# Patient Record
Sex: Female | Born: 1995 | Race: Black or African American | Hispanic: No | Marital: Single | Smoking: Former smoker
Health system: Southern US, Community
[De-identification: ages and names within clinical notes are randomized; demographics above are authoritative.]

## PROBLEM LIST (undated history)

## (undated) DIAGNOSIS — Z789 Other specified health status: Secondary | ICD-10-CM

## (undated) DIAGNOSIS — Z9109 Other allergy status, other than to drugs and biological substances: Secondary | ICD-10-CM

## (undated) HISTORY — PX: EYE MUSCLE SURGERY: SHX370

## (undated) HISTORY — PX: EYE SURGERY: SHX253

---
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
## 2017-01-13 ENCOUNTER — Emergency Department (HOSPITAL_COMMUNITY)
Admission: EM | Admit: 2017-01-13 | Discharge: 2017-01-14 | Disposition: A | Discharge status: Home / Self Care | Payer: No Typology Code available for payment source | Attending: Emergency Medicine | Admitting: Emergency Medicine

## 2017-01-13 ENCOUNTER — Encounter (HOSPITAL_COMMUNITY): Payer: Self-pay

## 2017-01-13 ENCOUNTER — Inpatient Hospital Stay (HOSPITAL_COMMUNITY)
Admission: AD | Admit: 2017-01-13 | Discharge: 2017-01-13 | Disposition: A | Discharge status: Home / Self Care | Payer: No Typology Code available for payment source | Source: Ambulatory Visit | Attending: Obstetrics and Gynecology | Admitting: Obstetrics and Gynecology

## 2017-01-13 DIAGNOSIS — R51 Headache: Secondary | ICD-10-CM | POA: Diagnosis not present

## 2017-01-13 DIAGNOSIS — M549 Dorsalgia, unspecified: Secondary | ICD-10-CM | POA: Diagnosis not present

## 2017-01-13 DIAGNOSIS — R110 Nausea: Secondary | ICD-10-CM | POA: Diagnosis not present

## 2017-01-13 DIAGNOSIS — N760 Acute vaginitis: Secondary | ICD-10-CM | POA: Insufficient documentation

## 2017-01-13 DIAGNOSIS — J45909 Unspecified asthma, uncomplicated: Secondary | ICD-10-CM | POA: Diagnosis not present

## 2017-01-13 DIAGNOSIS — M940 Chondrocostal junction syndrome [Tietze]: Secondary | ICD-10-CM | POA: Insufficient documentation

## 2017-01-13 DIAGNOSIS — B9689 Other specified bacterial agents as the cause of diseases classified elsewhere: Secondary | ICD-10-CM

## 2017-01-13 DIAGNOSIS — R1030 Lower abdominal pain, unspecified: Secondary | ICD-10-CM | POA: Insufficient documentation

## 2017-01-13 DIAGNOSIS — R0789 Other chest pain: Secondary | ICD-10-CM

## 2017-01-13 DIAGNOSIS — R55 Syncope and collapse: Secondary | ICD-10-CM | POA: Diagnosis present

## 2017-01-13 LAB — URINALYSIS, ROUTINE W REFLEX MICROSCOPIC
BILIRUBIN URINE: NEGATIVE
GLUCOSE, UA: NEGATIVE mg/dL
Hgb urine dipstick: NEGATIVE
KETONES UR: NEGATIVE mg/dL
LEUKOCYTES UA: NEGATIVE
Nitrite: NEGATIVE
PH: 5 (ref 5.0–8.0)
Protein, ur: 30 mg/dL — AB
SPECIFIC GRAVITY, URINE: 1.025 (ref 1.005–1.030)

## 2017-01-13 LAB — POCT PREGNANCY, URINE: Preg Test, Ur: NEGATIVE

## 2017-01-13 LAB — WET PREP, GENITAL
SPERM: NONE SEEN
TRICH WET PREP: NONE SEEN
YEAST WET PREP: NONE SEEN

## 2017-01-13 MED ORDER — IBUPROFEN 800 MG PO TABS
800.0000 mg | ORAL_TABLET | Freq: Once | ORAL | Status: AC
  Administered 2017-01-13: 800 mg via ORAL
  Filled 2017-01-13: qty 1

## 2017-01-13 MED ORDER — SODIUM CHLORIDE 0.9 % IV BOLUS (SEPSIS)
1000.0000 mL | Freq: Once | INTRAVENOUS | Status: AC
  Administered 2017-01-13: 1000 mL via INTRAVENOUS

## 2017-01-13 MED ORDER — METRONIDAZOLE 500 MG PO TABS: 500.0000 mg | ORAL_TABLET | Freq: Two times a day (BID) | ORAL | 0 refills | Status: DC

## 2017-01-13 MED ORDER — IBUPROFEN 800 MG PO TABS: 800.0000 mg | ORAL_TABLET | Freq: Once | ORAL | 0 refills | Status: AC

## 2017-01-13 MED ORDER — MEDROXYPROGESTERONE ACETATE 150 MG/ML IM SUSP
150.0000 mg | Freq: Once | INTRAMUSCULAR | Status: AC
  Administered 2017-01-13: 150 mg via INTRAMUSCULAR
  Filled 2017-01-13: qty 1

## 2017-01-13 MED ORDER — KETOROLAC TROMETHAMINE 30 MG/ML IJ SOLN
15.0000 mg | Freq: Once | INTRAMUSCULAR | Status: AC
  Administered 2017-01-13: 15 mg via INTRAVENOUS
  Filled 2017-01-13: qty 1

## 2017-01-13 NOTE — MAU Provider Note (Signed)
History   pt is nulparous BF last dose DMPA feb in with low abd pain x 3 wks. 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 Desires referral to GYN clinic for care and contraceptive management  CSN: [id number]  Arrival date & time 01/13/17  1116   None     Chief Complaint  Patient presents with  . Back Pain  . Headache  . Nausea    HPI  Past Medical History:  Diagnosis Date  . Asthma   . Pollen allergies     Past Surgical History:  Procedure Laterality Date  . EYE MUSCLE SURGERY     x 2 one left and one right - lazy eye  . EYE SURGERY    . LAPAROSCOPIC UNILATERAL SALPINGO OOPHERECTOMY Left 07/02/2014   Procedure: LAPAROSCOPIC LEFT OOPHERECTOMY;  Surgeon: Mora Bellman, MD;  Location: [facility];  Service: Gynecology;  Laterality: Left;    Family History  Problem Relation Age of Onset  . Diabetes Mother   . Diabetes Father   . Cancer Maternal Grandmother     Social History  Substance Use Topics  . Smoking status: Never Smoker  . Smokeless tobacco: Never Used  . Alcohol use No    OB History    Gravida Para Term Preterm AB Living   0 0 0 0 0 0   SAB TAB Ectopic Multiple Live Births   0 0 0 0        Review of Systems  Constitutional: Negative.   HENT: Negative.   Eyes: Negative.   Respiratory: Negative.   Cardiovascular: Negative.   Gastrointestinal: Positive for abdominal pain.  Endocrine: Negative.   Genitourinary: Negative.   Musculoskeletal: Negative.   Skin: Negative.   Allergic/Immunologic: Negative.   Neurological: Negative.   Hematological: Negative.   Psychiatric/Behavioral: Negative.     Allergies  Other  Home Medications    BP 130/70 (BP Location: Left Arm)   Pulse 81   Temp 98.2 F (36.8 C)   Resp 16   Physical Exam  Constitutional: She is oriented to person, place, and time. She appears well-developed and well-nourished.  HENT:  Head: Normocephalic.  Eyes: Pupils are equal, round, and reactive to light.  Neck: Normal  range of motion.  Cardiovascular: Normal rate, regular rhythm, normal heart sounds and intact distal pulses.   Pulmonary/Chest: Effort normal and breath sounds normal.  Abdominal: Soft. Bowel sounds are normal.  Genitourinary: Vaginal discharge found.  Musculoskeletal: Normal range of motion.  Neurological: She is alert and oriented to person, place, and time. She has normal reflexes.  Skin: Skin is warm and dry.  Psychiatric: She has a normal mood and affect. Her behavior is normal. Judgment and thought content normal.    MAU Course  Procedures (including critical care time)  Labs Reviewed  WET PREP, GENITAL  URINALYSIS, ROUTINE W REFLEX MICROSCOPIC  POCT PREGNANCY, URINE  GC/CHLAMYDIA PROBE AMP () NOT AT [facility]   No results found.   Dx: BV   MDM  Wet prep BV. Chla and GC obtained and to lab VSS, abd soft and non tender. Will refer to GYN clinic for eval. Flagyl 500 BID. D/C home

## 2017-01-13 NOTE — ED Provider Notes (Signed)
[facility] DEPT Provider Note   CSN: [id number] Arrival date & time: 01/13/17  2158 By signing my name below, I, Georgette Shell, attest that this documentation has been prepared under the direction and in the presence of Rolland Porter, MD. Electronically Signed: Georgette Shell, ED Scribe. 01/13/17. 11:34 PM.  Time seen: 23:20  History   Chief Complaint Chief Complaint  Patient presents with  . Near Syncope    HPI The history is provided by the patient and the EMS personnel. No language interpreter was used.   HPI Comments: Sheri Parker is a 21 y.o. female with h/o asthma, who presents to the Emergency Department complaining of constant chest pain beginning one week ago, worsening tonight. She states the pain is aching and constant and is diffusely across her whole chest. Pt also has associated clear rhinorrhea, nausea, shortness of breath, and cough that began 3 days ago. Pt further reports she has mild BLE pain and paresthesias that began three days ago. Pt states she has lost consciousness at least once a day for a week. Per EMS, they were called in because pt was near-syncope and had chest pain. She states she went upstairs because she felt short of breath and felt like she was going to pass out. She states she sat up in her bed in the room started spinning and her head got heavy and then she had a syncopal episode. Pt's CBG was 62 on scene and EMS had her eat candy which brought it up to 86. Pt reports she was unconscious and only vaguely remembers EMS on arrival. She was told her friends did CPR while waiting for EMS to arrive. She describes being diaphoretic when she woke up. She denies sneezing or sore throat. She states she's had nausea with vomiting for the past 2 days for total 3 times, and diarrhea once this week. She has been seen for this chest pain before the ED and was referred to cardiology which she has not done.  Pt is also complaining of intermittent, generalized lower abdominal pain  worsening two weeks ago. She states she has h/o an ovarian cyst that had to be removed one year ago and she has had this abdominal pain intermittently since. Pt also reports that this pain is due to her being "3-4 weeks late" on recieving her Depo-Provera shot. She was seen at 11 AM earlier today at [facility] for the same and was started on Flagyl twice a day. She also received her Depo-Provera shot. No recent long travel. Pt smokes marijuana but does not drink alcohol. Pt denies fever, chills, leg swelling, or any other associated symptoms.   PCP: None  Past Medical History:  Diagnosis Date  . Asthma   . Pollen allergies     Patient Active Problem List   Diagnosis Date Noted  . 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 Dizzy spells 01/04/2015  . Moderate headache 01/04/2015  . Left ovarian cyst 07/02/2014    Past Surgical History:  Procedure Laterality Date  . EYE MUSCLE SURGERY     x 2 one left and one right - lazy eye  . EYE SURGERY    . LAPAROSCOPIC UNILATERAL SALPINGO OOPHERECTOMY Left 07/02/2014   Procedure: LAPAROSCOPIC LEFT OOPHERECTOMY;  Surgeon: Mora Bellman, MD;  Location: [facility];  Service: Gynecology;  Laterality: Left;    OB History    Gravida Para Term Preterm AB Living   0 0 0 0 0 0   SAB TAB Ectopic Multiple Live Births  0 0 0 0         Home Medications    Prior to Admission medications   Medication Sig Start Date End Date Taking? Authorizing Provider  metroNIDAZOLE (FLAGYL) 500 MG tablet Take 1 tablet (500 mg total) by mouth 2 (two) times daily. 01/13/17   Keitha Butte, CNM    Family History Family History  Problem Relation Age of Onset  . Diabetes Mother   . Diabetes Father   . Cancer Maternal Grandmother     Social History Social History  Substance Use Topics  . Smoking status: Never Smoker  . Smokeless tobacco: Never Used  . Alcohol use No  employed + [employer] occassionally   Allergies   Other   Review of Systems Review of Systems    Constitutional: Negative for chills and fever.  HENT: Positive for rhinorrhea.   Respiratory: Positive for cough and shortness of breath.   Cardiovascular: Positive for chest pain. Negative for leg swelling.  Gastrointestinal: Positive for abdominal pain and nausea.  Neurological: Positive for syncope.  All other systems reviewed and are negative.    Physical Exam Updated Vital Signs BP 127/86   Pulse 66   Temp 98.7 F (37.1 C) (Oral)   Resp 18   Ht 5\' 10"  (1.778 m)   Wt 122 lb (55.3 kg)   SpO2 100%   BMI 17.51 kg/m   Vital signs normal    Orthostatic VS for the past 24 hrs:  BP- Lying Pulse- Lying BP- Sitting Pulse- Sitting BP- Standing at 0 minutes Pulse- Standing at 0 minutes  01/14/17 0009 115/74 58 112/81 87 120/84 95   Orthostatics positive for increase in pulse from lying to standing over 20 bpm    Physical Exam  Constitutional: She is oriented to person, place, and time. She appears well-developed and well-nourished.  Non-toxic appearance. She does not appear ill. No distress.  HENT:  Head: Normocephalic and atraumatic.  Right Ear: External ear normal.  Left Ear: External ear normal.  Nose: Nose normal. No mucosal edema or rhinorrhea.  Mouth/Throat: Oropharynx is clear and moist and mucous membranes are normal. No dental abscesses or uvula swelling.  Eyes: Pupils are equal, round, and reactive to light. Conjunctivae and EOM are normal.  Neck: Normal range of motion and full passive range of motion without pain. Neck supple.  Cardiovascular: Normal rate, regular rhythm and normal heart sounds.  Exam reveals no gallop and no friction rub.   No murmur heard. Pulmonary/Chest: Effort normal and breath sounds normal. No respiratory distress. She has no wheezes. She has no rhonchi. She has no rales. She exhibits tenderness. She exhibits no crepitus.    Very tender to palpation over all costochondral junctions.  Abdominal: Soft. Normal appearance and bowel sounds  are normal. She exhibits no distension. There is no tenderness. There is no rebound and no guarding.  Musculoskeletal: Normal range of motion. She exhibits no edema or tenderness.  Moves all extremities well.   Neurological: She is alert and oriented to person, place, and time. She has normal strength. No cranial nerve deficit.  Skin: Skin is warm, dry and intact. No rash noted. No erythema. No pallor.  Psychiatric: She has a normal mood and affect. Her speech is normal and behavior is normal. Her mood appears not anxious.  Nursing note and vitals reviewed.    ED Treatments / Results  Labs (all labs ordered are listed, but only abnormal results are displayed) Results for orders placed or performed  during the hospital encounter of 01/13/17  Comprehensive metabolic panel  Result Value Ref Range   Sodium 137 135 - 145 mmol/L   Potassium 3.5 3.5 - 5.1 mmol/L   Chloride 105 101 - 111 mmol/L   CO2 23 22 - 32 mmol/L   Glucose, Bld 85 65 - 99 mg/dL   BUN 10 6 - 20 mg/dL   Creatinine, Ser 0.68 0.44 - 1.00 mg/dL   Calcium 9.1 8.9 - 10.3 mg/dL   Total Protein 7.4 6.5 - 8.1 g/dL   Albumin 4.3 3.5 - 5.0 g/dL   AST 23 15 - 41 U/L   ALT 16 14 - 54 U/L   Alkaline Phosphatase 51 38 - 126 U/L   Total Bilirubin 0.7 0.3 - 1.2 mg/dL   GFR calc non Af Amer >60 >60 mL/min   GFR calc Af Amer >60 >60 mL/min   Anion gap 9 5 - 15  CBC with Differential  Result Value Ref Range   WBC 9.2 4.0 - 10.5 K/uL   RBC 5.30 (H) 3.87 - 5.11 MIL/uL   Hemoglobin 12.0 12.0 - 15.0 g/dL   HCT 36.7 36.0 - 46.0 %   MCV 69.2 (L) 78.0 - 100.0 fL   MCH 22.6 (L) 26.0 - 34.0 pg   MCHC 32.7 30.0 - 36.0 g/dL   RDW 14.0 11.5 - 15.5 %   Platelets 237 150 - 400 K/uL   Neutrophils Relative % 58 %   Lymphocytes Relative 33 %   Monocytes Relative 6 %   Eosinophils Relative 2 %   Basophils Relative 1 %   Neutro Abs 5.3 1.7 - 7.7 K/uL   Lymphs Abs 3.0 0.7 - 4.0 K/uL   Monocytes Absolute 0.6 0.1 - 1.0 K/uL   Eosinophils  Absolute 0.2 0.0 - 0.7 K/uL   Basophils Absolute 0.1 0.0 - 0.1 K/uL   Smear Review MORPHOLOGY UNREMARKABLE   D-dimer, quantitative  Result Value Ref Range   D-Dimer, Quant <0.27 0.00 - 0.50 ug/mL-FEU  Troponin I  Result Value Ref Range   Troponin I <0.03 <0.03 ng/mL  POC urine preg, ED  Result Value Ref Range   Preg Test, Ur NEGATIVE NEGATIVE   Laboratory interpretation all normal     EKG  EKG Interpretation  Date/Time:  Sunday January 13 2017 22:30:42 EDT Ventricular Rate:  76 PR Interval:    QRS Duration: 89 QT Interval:  371 QTC Calculation: 418 R Axis:   96 Text Interpretation:  Sinus rhythm Borderline right axis deviation LVH by voltage Nonspecific T abnrm, anterolateral leads ST elev, probable normal early repol pattern No significant change since last tracing 31 Jul 2016 Confirmed by Rolland Porter [phone]) on 01/13/2017 11:05:31 PM       Radiology No results found.  Procedures Procedures (including critical care time)  Medications Ordered in ED Medications  sodium chloride 0.9 % bolus 1,000 mL (1,000 mLs Intravenous New Bag/Given 01/13/17 2359)  ketorolac (TORADOL) 30 MG/ML injection 15 mg (15 mg Intravenous Given 01/13/17 2359)     Initial Impression / Assessment and Plan / ED Course  I have reviewed the triage vital signs and the nursing notes.  Pertinent labs & imaging results that were available during my care of the patient were reviewed by me and considered in my medical decision making (see chart for details).  DIAGNOSTIC STUDIES: Oxygen Saturation is 100% on RA, normal by my interpretation.   COORDINATION OF CARE: 11:34 PM-Discussed next steps with pt. Pt verbalized understanding and  is agreeable with the plan.  Patient was given IV fluids and IV Toradol for her chest wall pain. Laboratory testing was done including a d-dimer to rule out risk of PE. Patient does not smoke and she is on birth control pills.  12:57 AM-Pt explains she is now feeling better.  Blood work unremarkable. Pt encouraged to f/u with cardiology.  Final Clinical Impressions(s) / ED Diagnoses   Final diagnoses:  Syncope, unspecified syncope type  Costochondritis  Chest wall pain    New Prescriptions OTC ibuprofen or aleve  Plan discharge  Rolland Porter, MD, FACEP   I personally performed the services described in this documentation, which was scribed in my presence. The recorded information has been reviewed and considered.  Rolland Porter, MD, Barbette Or, MD 01/14/17 [id number]

## 2017-01-13 NOTE — ED Triage Notes (Signed)
Family states pt passed out alert and talking when ems arrived on scene blood sugar 86 pt upset and crying on scene. Alert and oriented x 3 on arrival to ER.

## 2017-01-13 NOTE — MAU Note (Signed)
Patient presents with stomach pain, nausea and headache x 2 weeks, discharge with odor.  Has been on Depo

## 2017-01-13 NOTE — Discharge Instructions (Signed)

## 2017-01-13 NOTE — ED Notes (Signed)
Refused to give urine

## 2017-01-13 NOTE — ED Notes (Signed)
Bed: ML46 Expected date:  Expected time:  Means of arrival:  Comments: 69f syncope

## 2017-01-14 LAB — COMPREHENSIVE METABOLIC PANEL
ALT: 16 U/L (ref 14–54)
AST: 23 U/L (ref 15–41)
Albumin: 4.3 g/dL (ref 3.5–5.0)
Alkaline Phosphatase: 51 U/L (ref 38–126)
Anion gap: 9 (ref 5–15)
BUN: 10 mg/dL (ref 6–20)
CO2: 23 mmol/L (ref 22–32)
CREATININE: 0.68 mg/dL (ref 0.44–1.00)
Calcium: 9.1 mg/dL (ref 8.9–10.3)
Chloride: 105 mmol/L (ref 101–111)
GFR calc non Af Amer: >60 mL/min (ref >60)
Glucose, Bld: 85 mg/dL (ref 65–99)
POTASSIUM: 3.5 mmol/L (ref 3.5–5.1)
SODIUM: 137 mmol/L (ref 135–145)
TOTAL PROTEIN: 7.4 g/dL (ref 6.5–8.1)
Total Bilirubin: 0.7 mg/dL (ref 0.3–1.2)

## 2017-01-14 LAB — CBC WITH DIFFERENTIAL/PLATELET
BASOS ABS: 0.1 10*3/uL (ref 0.0–0.1)
Basophils Relative: 1 %
Eosinophils Absolute: 0.2 10*3/uL (ref 0.0–0.7)
Eosinophils Relative: 2 %
HCT: 36.7 % (ref 36.0–46.0)
Hemoglobin: 12 g/dL (ref 12.0–15.0)
LYMPHS ABS: 3 10*3/uL (ref 0.7–4.0)
Lymphocytes Relative: 33 %
MCH: 22.6 pg — ABNORMAL LOW (ref 26.0–34.0)
MCHC: 32.7 g/dL (ref 30.0–36.0)
MCV: 69.2 fL — ABNORMAL LOW (ref 78.0–100.0)
MONO ABS: 0.6 10*3/uL (ref 0.1–1.0)
Monocytes Relative: 6 %
NEUTROS PCT: 58 %
Neutro Abs: 5.3 10*3/uL (ref 1.7–7.7)
PLATELETS: 237 10*3/uL (ref 150–400)
RBC: 5.3 MIL/uL — AB (ref 3.87–5.11)
RDW: 14 % (ref 11.5–15.5)
WBC: 9.2 10*3/uL (ref 4.0–10.5)

## 2017-01-14 LAB — D-DIMER, QUANTITATIVE (NOT AT ARMC)

## 2017-01-14 LAB — TROPONIN I

## 2017-01-14 LAB — GC/CHLAMYDIA PROBE AMP (~~LOC~~) NOT AT ARMC
CHLAMYDIA, DNA PROBE: NEGATIVE
Neisseria Gonorrhea: NEGATIVE

## 2017-01-14 LAB — POC URINE PREG, ED: PREG TEST UR: NEGATIVE

## 2017-01-14 NOTE — Discharge Instructions (Signed)
Drink more fluids. You can take ibuprofen 600 mg 4 times a day OR aleve 2 tabs twice a day for your chest wall pain. Use ice or heat for comfort. Consider being evaluated by a cardiologist as recommended before for your passing out spells. Recheck if you seem to be getting worse.

## 2017-01-15 ENCOUNTER — Encounter (HOSPITAL_COMMUNITY): Payer: Self-pay | Admitting: Emergency Medicine

## 2017-01-15 ENCOUNTER — Emergency Department (HOSPITAL_COMMUNITY)
Admission: EM | Admit: 2017-01-15 | Discharge: 2017-01-15 | Disposition: A | Discharge status: Home / Self Care | Payer: No Typology Code available for payment source | Attending: Emergency Medicine | Admitting: Emergency Medicine

## 2017-01-15 ENCOUNTER — Emergency Department (HOSPITAL_COMMUNITY): Payer: No Typology Code available for payment source

## 2017-01-15 DIAGNOSIS — M79671 Pain in right foot: Secondary | ICD-10-CM | POA: Diagnosis not present

## 2017-01-15 DIAGNOSIS — R0789 Other chest pain: Secondary | ICD-10-CM | POA: Insufficient documentation

## 2017-01-15 DIAGNOSIS — R55 Syncope and collapse: Secondary | ICD-10-CM | POA: Insufficient documentation

## 2017-01-15 DIAGNOSIS — F419 Anxiety disorder, unspecified: Secondary | ICD-10-CM | POA: Diagnosis present

## 2017-01-15 DIAGNOSIS — J45909 Unspecified asthma, uncomplicated: Secondary | ICD-10-CM | POA: Diagnosis not present

## 2017-01-15 LAB — CBC WITH DIFFERENTIAL/PLATELET
Basophils Absolute: 0.1 10*3/uL (ref 0.0–0.1)
Basophils Relative: 1 %
EOS PCT: 2 %
Eosinophils Absolute: 0.2 10*3/uL (ref 0.0–0.7)
HEMATOCRIT: 32.7 % — AB (ref 36.0–46.0)
HEMOGLOBIN: 10.6 g/dL — AB (ref 12.0–15.0)
LYMPHS ABS: 2.6 10*3/uL (ref 0.7–4.0)
Lymphocytes Relative: 34 %
MCH: 22.5 pg — AB (ref 26.0–34.0)
MCHC: 32.4 g/dL (ref 30.0–36.0)
MCV: 69.3 fL — AB (ref 78.0–100.0)
MONO ABS: 0.5 10*3/uL (ref 0.1–1.0)
MONOS PCT: 6 %
NEUTROS ABS: 4.2 10*3/uL (ref 1.7–7.7)
Neutrophils Relative %: 57 %
Platelets: 231 10*3/uL (ref 150–400)
RBC: 4.72 MIL/uL (ref 3.87–5.11)
RDW: 13.9 % (ref 11.5–15.5)
WBC: 7.6 10*3/uL (ref 4.0–10.5)

## 2017-01-15 LAB — PREGNANCY, URINE: Preg Test, Ur: NEGATIVE

## 2017-01-15 LAB — BASIC METABOLIC PANEL
Anion gap: 6 (ref 5–15)
BUN: 8 mg/dL (ref 6–20)
CHLORIDE: 110 mmol/L (ref 101–111)
CO2: 21 mmol/L — ABNORMAL LOW (ref 22–32)
CREATININE: 0.71 mg/dL (ref 0.44–1.00)
Calcium: 8.8 mg/dL — ABNORMAL LOW (ref 8.9–10.3)
GFR calc Af Amer: >60 mL/min (ref >60)
GFR calc non Af Amer: >60 mL/min (ref >60)
GLUCOSE: 85 mg/dL (ref 65–99)
POTASSIUM: 3.8 mmol/L (ref 3.5–5.1)
Sodium: 137 mmol/L (ref 135–145)

## 2017-01-15 LAB — I-STAT TROPONIN, ED: Troponin i, poc: 0 ng/mL (ref 0.00–0.08)

## 2017-01-15 NOTE — ED Notes (Signed)
Got patient off bed pan patient is resting with call bell in reach

## 2017-01-15 NOTE — Discharge Instructions (Signed)
Your workup and imaging has been reassuring in the ED. Unknown cause of your chest pain and syncopal episodes. It is important that she follow-up with a cardiologist for possible further workup and imaging. I also recommend that you obtain a PCP for close follow-up.  May also follow up with a neurologist as well. If you feel lightheaded or dizzy May keep candy in her pocketbook indication blood sugar goes low. If he develops any worsening symptoms or have further sequela episodes return to the ED for evaluation.

## 2017-01-15 NOTE — ED Provider Notes (Signed)
[facility] DEPT Provider Note   CSN: [id number] Arrival date & time: 01/15/17  2229     History   Chief Complaint Chief Complaint  Patient presents with  . Anxiety  . Chest Pain    HPI Sheri Parker is a 21 y.o. female.  HPI 21 year old African-American female past medical history significant for asthma presents to the ED today with ongoing complaints of anxiety, chest pain, syncope. Today patient was at work when she had a syncopal episode that was reported by bystanders. Patient states that prior to syncopal arising she felt dizzy, headache, paresthesias in her bilateral lower extremities with pain. The patient states that she did not hit her head. States that she sat up at work from her chair in the room started spinning and her headache got heavy and that she had a syncopal episode. EMS was called and patient was transported to the ED for evaluation. Further examination parents are at bedside and states that the patient has been having syncopal episodes associated with chest pain over the past year. Has been seen several times for same. Was encouraged to follow with the cardiologist but has not done so for unknown reason.  Patient reports that episode today she was unconscious only vaguely remembers the EMS ride. Mom reports the patient has had poor by mouth intake over the past few days. Patient also reports night sweats and fevers with intermittent headaches she cannot characterize the past year. Patient does report history of ovarian cyst that was removed one year ago. Patient does report being Depo-Provera shot.  Father also states that patient stepped on glass 2 weeks ago. States that they removed the glass however she has had some continued pain in her right foot which he thinks could be causing her symptoms. Denies any fevers, erythema, purulent drainage. States her tetanus is up-to-date.  The patient denies any recent hospitalizations/surgeries, prolonged immobilizations, lower  extremity edema or calf tenderness. Patient reports marijuana use but denies any alcohol use.  Patient was seen 2 days ago in ED for same. At that time she had normal workup. She also had a d-dimer that was negative. Low suspicion for PE at that time.  Pt denies any  vision changes,congestion, neck pain, cp, sob, cough, abd pain, n/v/d, urinary symptoms, change in bowel habits, melena, hematochezia.  Past Medical History:  Diagnosis Date  . Asthma   . Pollen allergies     Patient Active Problem List   Diagnosis Date Noted  . 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 Dizzy spells 01/04/2015  . Moderate headache 01/04/2015  . Left ovarian cyst 07/02/2014    Past Surgical History:  Procedure Laterality Date  . EYE MUSCLE SURGERY     x 2 one left and one right - lazy eye  . EYE SURGERY    . LAPAROSCOPIC UNILATERAL SALPINGO OOPHERECTOMY Left 07/02/2014   Procedure: LAPAROSCOPIC LEFT OOPHERECTOMY;  Surgeon: Mora Bellman, MD;  Location: [facility];  Service: Gynecology;  Laterality: Left;  . OVARIAN CYST REMOVAL  12/2015    OB History    Gravida Para Term Preterm AB Living   0 0 0 0 0 0   SAB TAB Ectopic Multiple Live Births   0 0 0 0         Home Medications    Prior to Admission medications   Medication Sig Start Date End Date Taking? Authorizing Provider  metroNIDAZOLE (FLAGYL) 500 MG tablet Take 1 tablet (500 mg total) by mouth 2 (two) times daily. 01/13/17  Keitha Butte, CNM    Family History Family History  Problem Relation Age of Onset  . Diabetes Mother   . Diabetes Father   . Cancer Maternal Grandmother     Social History Social History  Substance Use Topics  . Smoking status: Never Smoker  . Smokeless tobacco: Never Used  . Alcohol use No     Allergies   Other   Review of Systems Review of Systems  Constitutional: Positive for chills and fever.  HENT: Negative for congestion.   Eyes: Negative for visual disturbance.  Respiratory: Negative for cough and  shortness of breath.   Cardiovascular: Positive for chest pain. Negative for palpitations and leg swelling.  Gastrointestinal: Negative for abdominal pain, diarrhea, nausea and vomiting.  Genitourinary: Negative for dysuria, flank pain, frequency, hematuria, urgency, vaginal bleeding and vaginal discharge.  Musculoskeletal: Positive for myalgias. Negative for arthralgias.  Skin: Negative for rash.  Neurological: Positive for dizziness, syncope, weakness, light-headedness, numbness and headaches.  Psychiatric/Behavioral: Negative for sleep disturbance. The patient is not nervous/anxious.      Physical Exam Updated Vital Signs BP 124/87 (BP Location: Right Arm)   Pulse (!) 59   Temp 98.2 F (36.8 C) (Oral)   Resp 11   Ht 5\' 7"  (1.702 m)   Wt 59 kg (130 lb)   SpO2 100%   BMI 20.36 kg/m   Physical Exam  Constitutional: She is oriented to person, place, and time. She appears well-developed and well-nourished.  Non-toxic appearance. No distress.  HENT:  Head: Normocephalic and atraumatic.  Nose: Nose normal.  Mouth/Throat: Oropharynx is clear and moist.  Eyes: Pupils are equal, round, and reactive to light. Conjunctivae and EOM are normal. Right eye exhibits no discharge. Left eye exhibits no discharge.  Neck: Normal range of motion. Neck supple. No JVD present. No tracheal deviation present.  Cardiovascular: Normal rate, regular rhythm, normal heart sounds and intact distal pulses.  Exam reveals no gallop and no friction rub.   No murmur heard. Pulmonary/Chest: Effort normal and breath sounds normal. No respiratory distress. She has no wheezes. She has no rales. She exhibits no tenderness.  No hypoxia or tachypnea.  Abdominal: Soft. Bowel sounds are normal. She exhibits no distension. There is no tenderness. There is no rebound and no guarding.  Musculoskeletal: Normal range of motion.  No lower extremity edema or calf tenderness.  Lymphadenopathy:    She has no cervical  adenopathy.  Neurological: She is alert and oriented to person, place, and time.  The patient is alert, attentive, and oriented x 3. Speech is clear. Cranial nerve II-VII grossly intact. Negative pronator drift. Sensation intact. Strength 5/5 in all extremities. Reflexes 2+ and symmetric at biceps, triceps, knees, and ankles. Rapid alternating movement and fine finger movements intact. Romberg is absent. Posture and gait normal.   Skin: Skin is warm and dry. Capillary refill takes less than 2 seconds. She is not diaphoretic.  Patient has a healed wound to the plantar region of her right foot. No erythema, purulent drainage. Tender to palpation.  Psychiatric: Her behavior is normal. Judgment and thought content normal.  Nursing note and vitals reviewed.    ED Treatments / Results  Labs (all labs ordered are listed, but only abnormal results are displayed) Labs Reviewed  BASIC METABOLIC PANEL - Abnormal; Notable for the following:       Result Value   CO2 21 (*)    Calcium 8.8 (*)    All other components within normal limits  CBC WITH DIFFERENTIAL/PLATELET - Abnormal; Notable for the following:    Hemoglobin 10.6 (*)    HCT 32.7 (*)    MCV 69.3 (*)    MCH 22.5 (*)    All other components within normal limits  PREGNANCY, URINE  I-STAT TROPONIN, ED    EKG  EKG Interpretation  Date/Time:  Tuesday January 15 2017 08:37:32 EDT Ventricular Rate:  59 PR Interval:    QRS Duration: 93 QT Interval:  394 QTC Calculation: 391 R Axis:   85 Text Interpretation:  Sinus rhythm LVH by voltage Abnrm T, consider ischemia, anterolateral lds Benign early repolarization No significant change since last tracing Confirmed by Brantley Stage [phone]) on 01/15/2017 9:11:06 AM       Radiology Dg Chest 2 View  Result Date: 01/15/2017 CLINICAL DATA:  Two weeks of cough, syncopal episode today. History of asthma. Nonsmoker. EXAM: CHEST  2 VIEW COMPARISON:  Chest x-ray dated January 13, 2015 FINDINGS: The lungs  are mildly hyperinflated and clear. There is no pneumothorax or pleural effusion. The heart and pulmonary vascularity are normal. The mediastinum is normal in width. The bony thorax is unremarkable. IMPRESSION: Hyperinflation consistent with known reactive airway disease. There is no acute cardiopulmonary abnormality. Electronically Signed   By: David  Martinique M.D.   On: 01/15/2017 10:21   Ct Head Wo Contrast  Result Date: 01/15/2017 CLINICAL DATA:  Chest pain, syncope EXAM: CT HEAD WITHOUT CONTRAST TECHNIQUE: Contiguous axial images were obtained from the base of the skull through the vertex without intravenous contrast. COMPARISON:  None. FINDINGS: Brain: No acute intracranial abnormality. Specifically, no hemorrhage, hydrocephalus, mass lesion, acute infarction, or significant intracranial injury. Vascular: No hyperdense vessel or unexpected calcification. Skull: No acute calvarial abnormality. Sinuses/Orbits: Visualized paranasal sinuses and mastoids clear. Orbital soft tissues unremarkable. Other: None IMPRESSION: Normal study. Electronically Signed   By: Rolm Baptise M.D.   On: 01/15/2017 10:26   Dg Foot Complete Right  Result Date: 01/15/2017 CLINICAL DATA:  Foot pain. The patient stepped on glass 2 weeks ago. EXAM: RIGHT FOOT COMPLETE - 3+ VIEW COMPARISON:  Radiographs dated 12/20/2013 FINDINGS: There is no evidence of fracture or dislocation. There is no evidence of arthropathy or other focal bone abnormality. Soft tissues are unremarkable. IMPRESSION: Negative. Electronically Signed   By: Lorriane Shire M.D.   On: 01/15/2017 10:21    Procedures Procedures (including critical care time)  Medications Ordered in ED Medications - No data to display   Initial Impression / Assessment and Plan / ED Course  I have reviewed the triage vital signs and the nursing notes.  Pertinent labs & imaging results that were available during my care of the patient were reviewed by me and considered in my  medical decision making (see chart for details).     Patient presents to the ED today with complaints of syncopal episode, chest pain. The patient's symptoms have been ongoing for the past year. She has been seen several times in the ED for the same. She has been referred to cardiology in the past however has not followed up with them. Patient recently seen 2 days ago for the same complaint. At that time she had a negative workup and her d-dimer was normal.  The parents are at bedside and reports several other complaints including poor by mouth intake, frequent headaches at night that her on characterized by patient, night sweats, intermittent subjective fevers. He also reports pain of the right foot after stepping on glass 2 weeks ago.  Patient's tetanus is up-to-date. X-ray obtained of the foot shows no obtained foreign body. Patient is neurovascularly intact in all extremities. Patient has no focal neuro deficits.  Physical exam is unremarkable. Lab work has been reassuring. Glucose is normal in the ED today. Pregnancy test is negative. No leukocytosis. Electrolytes are within normal limits. Hemoglobin is slightly low at 10.2 however baseline appears to be 10-12. History of iron deficiency anemia in the past. Patient denies any hematochezia, melena or heavy vaginal bleeding. Patient is not tachycardic or hypotensive and will be symptomatic of her anemia. Troponin is negative. EKG shows some LVH but is unchanged from her baseline. Clinical presentation is not concerning for ACS, pneumonia, PE. Patient with recent d-dimer 2 days ago did not feel that repeat d-dimer is necessary today.  X-ray of chest was normal. CT scan of head was obtained given patient's fever, night sweats, headaches, single episode. This was unremarkable. The patient feels much improved since arrival. She has no further complaints at this time. Discussed with parents and patient the importance of following up with cardiology and  neurology. Patient will likely need an echo cardiogram and a Holter monitor as indicated by cardiology. She also need primary care follow-up. Unsure patient's etiologies however do not feel there is any further emergent indication for care at this time.  On discharge her vital signs are reassuring. I have talked with Dr. Oleta Mouse concerning patient to evaluate patient as well and believes the patient is safe for discharge. Patient has been given the appropriate follow-up and referrals.  Pt is hemodynamically stable, in NAD, & able to ambulate in the ED. Evaluation does not show pathology that would require ongoing emergent intervention or inpatient treatment. I explained the diagnosis to the patient. Pain has been managed & has no complaints prior to dc. Pt is comfortable with above plan and is stable for discharge at this time. All questions were answered prior to disposition. Strict return precautions for f/u to the ED were discussed. Encouraged follow up with PCP.   Final Clinical Impressions(s) / ED Diagnoses   Final diagnoses:  Atypical chest pain  Syncope, unspecified syncope type  Right foot pain    New Prescriptions New Prescriptions   No medications on file     Aaron Edelman 01/15/17 1137    Doristine Devoid, PA-C 01/15/17 1137    Forde Dandy, MD 01/15/17 1640

## 2017-01-15 NOTE — ED Provider Notes (Signed)
Medical screening examination/treatment/procedure(s) were conducted as a shared visit with non-physician practitioner(s) and myself.  I personally evaluated the patient during the encounter.   EKG Interpretation  Date/Time:  Tuesday January 15 2017 08:37:32 EDT Ventricular Rate:  59 PR Interval:    QRS Duration: 93 QT Interval:  394 QTC Calculation: 391 R Axis:   85 Text Interpretation:  Sinus rhythm LVH by voltage Abnrm T, consider ischemia, anterolateral lds Benign early repolarization No significant change since last tracing Confirmed by Brantley Stage [phone]) on 01/15/2017 9:11:61 AM      21 year old female who presents with syncopal episode. She has a history of recurrent syncope and dizzy spells. States that she was at work today when she began to have left-sided chest discomfort, hyperventilation, lightheadedness followed by syncope. States that this happens nearly once a day for the past several months. Patient was seen in the emergency department 2 days ago for same complaint. At that time blood work including d-dimer was unremarkable.  On chart review, patient has had frequent ED visits in the past for similar presentation. She had been referred to cardiology for Holter monitoring and echo, but has yet to get this done. At the time of my evaluation she states that she feels much better now, and often does feel better when she takes deep breaths and relax.  Her EKG today does show some mild LVH by voltage. However, it is unchanged from previous EKGs. Cardiac enzymes are negative. Chest x-ray does not show acute cardiopulmonary processes. On previous negative d-dimer 2 days ago, and I do not feel she requires repeating at this time. Remainder blood work is reassuring.  I do feel the patient is stable for discharge home.  I did discuss importance of following up with cardiology for echo and Holter monitoring as indicated. Parents also present during this conversation. Strict return and follow-up  instructions reviewed. They all expressed understanding of all discharge instructions and felt comfortable with the plan of care.    Forde Dandy, MD 01/15/17 [phone]

## 2017-01-15 NOTE — ED Triage Notes (Signed)
Patient coming from work via [facility] complaining of chest pain, hyperventilation and anxiety. Patient seen at [facility] 2 days ago for same. Patient complaining 10/10 chest pain that intensifies when taking a deep breath. 12 lead unremarkable with EMS. Hx of asthma and anxiety. Patient alert and oriented times 4 on arrival

## 2017-01-15 NOTE — ED Notes (Signed)
Patient dressed and states she "is ready to go". Patient informed that her discharge papers haven't been printed and once they are she will be discharged

## 2017-01-22 ENCOUNTER — Encounter: Payer: Self-pay | Admitting: General Practice

## 2017-01-23 ENCOUNTER — Telehealth: Payer: Self-pay | Admitting: *Deleted

## 2017-01-23 NOTE — Telephone Encounter (Signed)
Sheri Parker called 01/22/17 pm and left a message that she had gotten a call from our office , but not sure what it was about- request a call back and states may leave a brief message if she doesn't respond.  I called Sheri Parker and she answered but said she would call back, heard noice in background. Per chart review registrars were calling to tell her about an appointment that was scheduled for her 02/11/17 at 2:40, they have sent a letter.

## 2017-01-23 NOTE — Telephone Encounter (Signed)
Sheri Parker left another voicemessage this am that she is returning our call. I called Sheri Parker back and informed her per chart review that clinical staff did not call her, but registrars were calling to notify her of her appointment for 02/11/17 2:40 for abd pain. She states she is not having pain anymore and asks that it be cancelled. I informed her I would notify registrars.  She states she is supposed to come back for depo-provera and is gong to call later to make that appointment for October.

## 2017-02-11 ENCOUNTER — Ambulatory Visit: Payer: No Typology Code available for payment source | Admitting: Obstetrics and Gynecology

## 2017-02-27 ENCOUNTER — Emergency Department (HOSPITAL_COMMUNITY)
Admission: EM | Admit: 2017-02-27 | Discharge: 2017-02-27 | Discharge status: Against Medical Advice | Payer: Self-pay | Attending: Emergency Medicine | Admitting: Emergency Medicine

## 2017-02-27 NOTE — ED Notes (Signed)
Pt called from lobby with no response. 

## 2017-02-27 NOTE — ED Notes (Signed)
Attempted to call patient for assign room to triage and treat, no answer.

## 2017-02-28 ENCOUNTER — Encounter (HOSPITAL_COMMUNITY): Payer: Self-pay | Admitting: Emergency Medicine

## 2017-02-28 ENCOUNTER — Emergency Department (HOSPITAL_COMMUNITY)
Admission: EM | Admit: 2017-02-28 | Discharge: 2017-02-28 | Disposition: A | Discharge status: Home / Self Care | Payer: No Typology Code available for payment source | Attending: Emergency Medicine | Admitting: Emergency Medicine

## 2017-02-28 DIAGNOSIS — M791 Myalgia: Secondary | ICD-10-CM | POA: Insufficient documentation

## 2017-02-28 DIAGNOSIS — F17290 Nicotine dependence, other tobacco product, uncomplicated: Secondary | ICD-10-CM | POA: Diagnosis not present

## 2017-02-28 DIAGNOSIS — Z79899 Other long term (current) drug therapy: Secondary | ICD-10-CM | POA: Insufficient documentation

## 2017-02-28 DIAGNOSIS — J45909 Unspecified asthma, uncomplicated: Secondary | ICD-10-CM | POA: Insufficient documentation

## 2017-02-28 DIAGNOSIS — J029 Acute pharyngitis, unspecified: Secondary | ICD-10-CM | POA: Diagnosis present

## 2017-02-28 DIAGNOSIS — B349 Viral infection, unspecified: Secondary | ICD-10-CM | POA: Diagnosis not present

## 2017-02-28 MED ORDER — BENZONATATE 100 MG PO CAPS: 100.0000 mg | ORAL_CAPSULE | Freq: Three times a day (TID) | ORAL | 0 refills | Status: DC

## 2017-02-28 MED ORDER — IBUPROFEN 100 MG/5ML PO SUSP
600.0000 mg | Freq: Once | ORAL | Status: AC
Start: 2017-02-28 — End: 2017-02-28
  Administered 2017-02-28: 600 mg via ORAL
  Filled 2017-02-28: qty 30

## 2017-02-28 MED ORDER — IBUPROFEN 100 MG/5ML PO SUSP: 10.0000 mg/kg | Freq: Four times a day (QID) | ORAL | 1 refills | Status: DC | PRN

## 2017-02-28 NOTE — ED Triage Notes (Signed)
Patient c/o body aches, ear ache, sore throat for past 3 days.

## 2017-02-28 NOTE — ED Provider Notes (Signed)
[facility] DEPT Provider Note   CSN: [id number] Arrival date & time: 02/28/17  0732     History   Chief Complaint Chief Complaint  Patient presents with  . Sore Throat  . Generalized Body Aches    HPI Sheri Parker is a 21 y.o. female.  HPI Patient reports that she's had a sore throat as well as body aches for 3 days. Subjective fever.Patient reports she's been coughing up small amount of mucus. No shortness of breath or chest pain. His throat hurts with swallowing. No difficulty breathing. Occasional loose diarrheal stool. Patient works in a call center and reports that her throat hurts a lot more after having been at work and during the night. Past Medical History:  Diagnosis Date  . Asthma   . Pollen allergies     Patient Active Problem List   Diagnosis Date Noted  . 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 Dizzy spells 01/04/2015  . Moderate headache 01/04/2015  . Left ovarian cyst 07/02/2014    Past Surgical History:  Procedure Laterality Date  . EYE MUSCLE SURGERY     x 2 one left and one right - lazy eye  . EYE SURGERY    . LAPAROSCOPIC UNILATERAL SALPINGO OOPHERECTOMY Left 07/02/2014   Procedure: LAPAROSCOPIC LEFT OOPHERECTOMY;  Surgeon: Mora Bellman, MD;  Location: [facility];  Service: Gynecology;  Laterality: Left;  . OVARIAN CYST REMOVAL  12/2015    OB History    Gravida Para Term Preterm AB Living   0 0 0 0 0 0   SAB TAB Ectopic Multiple Live Births   0 0 0 0         Home Medications    Prior to Admission medications   Medication Sig Start Date End Date Taking? Authorizing Provider  benzonatate (TESSALON) 100 MG capsule Take 1 capsule (100 mg total) by mouth every 8 (eight) hours. 02/28/17   Charlesetta Shanks, MD  ibuprofen (CHILD IBUPROFEN) 100 MG/5ML suspension Take 29.5 mLs (590 mg total) by mouth every 6 (six) hours as needed. 02/28/17   Charlesetta Shanks, MD  metroNIDAZOLE (FLAGYL) 500 MG tablet Take 1 tablet (500 mg total) by mouth 2 (two) times daily.  01/13/17   Keitha Butte, CNM    Family History Family History  Problem Relation Age of Onset  . Diabetes Mother   . Diabetes Father   . Cancer Maternal Grandmother     Social History Social History  Substance Use Topics  . Smoking status: Current Every Day Smoker    Types: E-cigarettes  . Smokeless tobacco: Never Used  . Alcohol use No     Allergies   Other   Review of Systems Review of Systems 10 Systems reviewed and are negative for acute change except as noted in the HPI.   Physical Exam Updated Vital Signs BP 108/74 (BP Location: Right Arm)   Pulse 83   Temp 98.9 F (37.2 C) (Oral)   Resp 16   Ht 5\' 7"  (1.702 m)   Wt 59 kg (130 lb)   SpO2 100%   BMI 20.36 kg/m   Physical Exam  Constitutional: She is oriented to person, place, and time. She appears well-developed and well-nourished. No distress.  Patient is well in appearance. Her voice is clear. No respiratory distress.  HENT:  Head: Normocephalic and atraumatic.  Nose: Nose normal.  Oropharynx pink and moist. Posterior oropharynx widely patent. No tonsillar erythema or exudate. No tonsillar enlargement.Dentition excellent condition.  Eyes: Pupils are equal, round, and  reactive to light. Conjunctivae and EOM are normal.  Neck: Neck supple. No tracheal deviation present. No thyromegaly present.  Objectively, neck exam is normal. She endorses some tenderness along the tonsillar lymph node region. No palpable nodes. No stridor. No meningismus. Soft tissues normal.  Cardiovascular: Normal rate and regular rhythm.   No murmur heard. Pulmonary/Chest: Effort normal and breath sounds normal. No stridor. No respiratory distress.  Abdominal: Soft. There is no tenderness.  Musculoskeletal: Normal range of motion. She exhibits no edema, tenderness or deformity.  Lymphadenopathy:    She has no cervical adenopathy.  Neurological: She is alert and oriented to person, place, and time. No cranial nerve deficit. She  exhibits normal muscle tone. Coordination normal.  Skin: Skin is warm and dry.  Psychiatric: She has a normal mood and affect.  Nursing note and vitals reviewed.    ED Treatments / Results  Labs (all labs ordered are listed, but only abnormal results are displayed) Labs Reviewed - No data to display  EKG  EKG Interpretation None       Radiology No results found.  Procedures Procedures (including critical care time)  Medications Ordered in ED Medications  ibuprofen (ADVIL,MOTRIN) 100 MG/5ML suspension 600 mg (not administered)     Initial Impression / Assessment and Plan / ED Course  I have reviewed the triage vital signs and the nursing notes.  Pertinent labs & imaging results that were available during my care of the patient were reviewed by me and considered in my medical decision making (see chart for details).     Final Clinical Impressions(s) / ED Diagnoses   Final diagnoses:  Viral illness  Viral pharyngitis   Patient had concern for recurrent strep throat. On exam however throat is normal with no erythema and no exudates. Patient describes constitutional symptoms including myalgias, headache and diarrhea as well as nasal congestion and drainage. Description is consistent with viral syndrome. On exam patient is well in appearance without any anomalies on exam. At this time I feel that symptomatic treatment is appropriate. I did discuss with the patient as she reports she's had problems with recurrent sore throat particularly working in a call center, she should schedule follow-up with ENT to determine if she has any laryngeal inflammation or nodules. Of note however her voice is clear and there is no stridor. New Prescriptions New Prescriptions   BENZONATATE (TESSALON) 100 MG CAPSULE    Take 1 capsule (100 mg total) by mouth every 8 (eight) hours.   IBUPROFEN (CHILD IBUPROFEN) 100 MG/5ML SUSPENSION    Take 29.5 mLs (590 mg total) by mouth every 6 (six) hours as  needed.     Charlesetta Shanks, MD 02/28/17 [phone]

## 2017-04-01 ENCOUNTER — Ambulatory Visit: Payer: No Typology Code available for payment source

## 2017-04-18 ENCOUNTER — Ambulatory Visit (INDEPENDENT_AMBULATORY_CARE_PROVIDER_SITE_OTHER): Payer: No Typology Code available for payment source | Admitting: *Deleted

## 2017-04-18 DIAGNOSIS — Z3042 Encounter for surveillance of injectable contraceptive: Secondary | ICD-10-CM | POA: Diagnosis not present

## 2017-04-18 DIAGNOSIS — Z30013 Encounter for initial prescription of injectable contraceptive: Secondary | ICD-10-CM

## 2017-04-18 DIAGNOSIS — Z3202 Encounter for pregnancy test, result negative: Secondary | ICD-10-CM

## 2017-04-18 LAB — POCT PREGNANCY, URINE: Preg Test, Ur: NEGATIVE

## 2017-04-18 MED ORDER — MEDROXYPROGESTERONE ACETATE 150 MG/ML IM SUSP
150.0000 mg | INTRAMUSCULAR | Status: AC
  Administered 2017-04-18 – 2021-11-02 (×6): 150 mg via INTRAMUSCULAR

## 2017-04-18 NOTE — Progress Notes (Signed)
Patient presents for pregnancy test to start depo provera. Pregnancy test negative. Depo given, tolerated well.

## 2017-04-18 NOTE — Progress Notes (Signed)
Chart reviewed for nurse visit. Agree with plan of care.   [place]arr [place], [place] 04/18/2017 5:09 PM

## 2017-04-29 ENCOUNTER — Emergency Department (HOSPITAL_COMMUNITY)
Admission: EM | Admit: 2017-04-29 | Discharge: 2017-04-29 | Disposition: A | Discharge status: Home / Self Care | Payer: No Typology Code available for payment source | Attending: Emergency Medicine | Admitting: Emergency Medicine

## 2017-04-29 ENCOUNTER — Other Ambulatory Visit: Payer: Self-pay

## 2017-04-29 ENCOUNTER — Encounter (HOSPITAL_COMMUNITY): Payer: Self-pay

## 2017-04-29 DIAGNOSIS — F17290 Nicotine dependence, other tobacco product, uncomplicated: Secondary | ICD-10-CM | POA: Diagnosis not present

## 2017-04-29 DIAGNOSIS — H9312 Tinnitus, left ear: Secondary | ICD-10-CM | POA: Insufficient documentation

## 2017-04-29 DIAGNOSIS — H9192 Unspecified hearing loss, left ear: Secondary | ICD-10-CM | POA: Insufficient documentation

## 2017-04-29 DIAGNOSIS — H9202 Otalgia, left ear: Secondary | ICD-10-CM | POA: Diagnosis present

## 2017-04-29 DIAGNOSIS — H6123 Impacted cerumen, bilateral: Secondary | ICD-10-CM | POA: Diagnosis not present

## 2017-04-29 MED ORDER — DOCUSATE SODIUM 50 MG/5ML PO LIQD
50.0000 mg | Freq: Once | ORAL | Status: AC
  Administered 2017-04-29: 50 mg via OTIC
  Filled 2017-04-29: qty 10

## 2017-04-29 MED ORDER — CARBAMIDE PEROXIDE 6.5 % OT SOLN: 5.0000 [drp] | Freq: Two times a day (BID) | OTIC | 0 refills | Status: DC

## 2017-04-29 NOTE — Discharge Instructions (Signed)
DO NOT PUT ANYTHING INTO YOUR EAR OTHER THAN THE EAR DROPS YOU'RE BEING PRESCRIBED. Use prescribed ear drops as directed. Alternate between tylenol and motrin as needed for pain. Follow up with the ENT doctor in 5-7 days for recheck and ongoing management of your ear wax build up. Return to the ER for changes or worsening symptoms.

## 2017-04-29 NOTE — ED Triage Notes (Addendum)
Pt states that she has been having left ear pain  7/10 described  as throbbing x 2 weeks pt states that she has a constant  ringing in ear. Denies any drainage, upon assessment appears to have cerumen buildup

## 2017-04-29 NOTE — ED Notes (Signed)
Patient's ear irrigated by this nurse. EDPA updated.

## 2017-04-29 NOTE — ED Notes (Signed)
Discharge instructions reviewed with patient. Patient verbalizes understanding. VSS.   

## 2017-04-29 NOTE — ED Provider Notes (Signed)
[facility] DEPT Provider Note   CSN: [id number] Arrival date & time: 04/29/17  1325     History   Chief Complaint Chief Complaint  Patient presents with  . Otalgia    HPI Sheri Parker is a 21 y.o. female with a PMHx of seasonal allergies, chronic headaches, and ovarian cysts, who presents to the ED with complaints of 2.5 weeks of left ear pain, diminished hearing, and tinnitus in the left ear.  Patient states that she attempted to use a hair pin as well as a Q-tip to try to get whatever was in there out, because she thought there was water in her ear, however she was unsuccessful.  She describes the pain as 7/10 intermittent sore nonradiating left ear pain which worsens with laying down on her left side, and has been unrelieved with Excedrin.  She has not used anything else for her symptoms.  No known sick contacts, no recent swelling.  She has never had any ear surgeries.  She mentions that one other time she was seen in the ER for vertigo related to cerumen impactions.  She denies any fevers, chills, rhinorrhea, ear drainage, or any other complaints at this time.   The history is provided by the patient and medical records. No language interpreter was used.  Otalgia  This is a recurrent problem. The current episode started more than 1 week ago. There is pain in the left ear. The problem occurs daily. The problem has not changed since onset.There has been no fever. The pain is at a severity of 7/10. The pain is moderate. Associated symptoms include hearing loss. Pertinent negatives include no ear discharge and no rhinorrhea.    Past Medical History:  Diagnosis Date  . Pollen allergies     Patient Active Problem List   Diagnosis Date Noted  . 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 Dizzy spells 01/04/2015  . Moderate headache 01/04/2015  . Left ovarian cyst 07/02/2014    Past Surgical History:  Procedure Laterality Date  . EYE MUSCLE SURGERY     x 2 one  left and one right - lazy eye  . EYE SURGERY    . OVARIAN CYST REMOVAL  12/2015    OB History    Gravida Para Term Preterm AB Living   0 0 0 0 0 0   SAB TAB Ectopic Multiple Live Births   0 0 0 0         Home Medications    Prior to Admission medications   Medication Sig Start Date End Date Taking? Authorizing Provider  benzonatate (TESSALON) 100 MG capsule Take 1 capsule (100 mg total) by mouth every 8 (eight) hours. 02/28/17   Charlesetta Shanks, MD  ibuprofen (CHILD IBUPROFEN) 100 MG/5ML suspension Take 29.5 mLs (590 mg total) by mouth every 6 (six) hours as needed. 02/28/17   Charlesetta Shanks, MD  metroNIDAZOLE (FLAGYL) 500 MG tablet Take 1 tablet (500 mg total) by mouth 2 (two) times daily. 01/13/17   Keitha Butte, CNM    Family History Family History  Problem Relation Age of Onset  . Diabetes Mother   . Diabetes Father   . Cancer Maternal Grandmother     Social History Social History   Tobacco Use  . Smoking status: Current Every Day Smoker    Types: E-cigarettes  . Smokeless tobacco: Never Used  Substance Use Topics  . Alcohol use: No  . Drug use: No     Allergies   Other  Review of Systems Review of Systems  Constitutional: Negative for chills and fever.  HENT: Positive for ear pain, hearing loss and tinnitus. Negative for ear discharge and rhinorrhea.   Allergic/Immunologic: Negative for immunocompromised state.     Physical Exam Updated Vital Signs BP 121/66 (BP Location: Left Arm)   Pulse 68   Temp 98.4 F (36.9 C) (Oral)   Resp 20   Ht 5\' 7"  (1.702 m)   Wt 59 kg (130 lb)   SpO2 100%   BMI 20.36 kg/m   Physical Exam  Constitutional: She is oriented to person, place, and time. Vital signs are normal. She appears well-developed and well-nourished.  Non-toxic appearance. No distress.  Afebrile, nontoxic, NAD, talking on the phone, initially hesitant to get off the phone but eventually hangs up  HENT:  Head: Normocephalic and atraumatic.    Right Ear: Hearing and external ear normal. A foreign body (cerumen impaction) is present.  Left Ear: Hearing and external ear normal. A foreign body (cerumen impaction) is present.  Mouth/Throat: Mucous membranes are normal.  Bilateral cerumen impactions which obscure the ability to see the TMs. Canals without drainage or erythema. L ear cerumen impaction deep within the EAC  Eyes: Conjunctivae and EOM are normal. Right eye exhibits no discharge. Left eye exhibits no discharge.  Neck: Normal range of motion. Neck supple.  Cardiovascular: Normal rate and intact distal pulses.  Pulmonary/Chest: Effort normal. No respiratory distress.  Abdominal: Normal appearance. She exhibits no distension.  Musculoskeletal: Normal range of motion.  Neurological: She is alert and oriented to person, place, and time. She has normal strength. No sensory deficit.  Skin: Skin is warm, dry and intact. No rash noted.  Psychiatric: She has a normal mood and affect. Her behavior is normal.  Nursing note and vitals reviewed.    ED Treatments / Results  Labs (all labs ordered are listed, but only abnormal results are displayed) Labs Reviewed - No data to display  EKG  EKG Interpretation None       Radiology No results found.  Procedures .Ear Cerumen Removal Date/Time: 04/29/2017 3:44 PM Performed by: Reece Agar, PA-C Authorized by: Reece Agar, [place]   Consent:    Consent obtained:  Verbal   Consent given by:  Patient   Risks discussed:  Incomplete removal, pain and TM perforation   Alternatives discussed:  Referral Procedure details:    Location:  L ear   Procedure type: curette   Post-procedure details:    Hearing quality:  Normal   Patient tolerance of procedure:  Tolerated well, no immediate complications (terminated at provier's decision due to inability to reach cerumen impaction with curette) Comments:     Nurse flushed L ear twice after using Colace to soften cerumen; after  irrigations, minimal cerumen able to be reached and removed, but the remainder of the cerumen remains too deep to remove with curette; at risk of TM perforation, procedure was terminated prior to complete removal of cerumen due to inability to safely get to the cerumen impaction.    (including critical care time)  Medications Ordered in ED Medications  docusate (COLACE) 50 MG/5ML liquid 50 mg (50 mg Left EAR Given 04/29/17 1436)     Initial Impression / Assessment and Plan / ED Course  I have reviewed the triage vital signs and the nursing notes.  Pertinent labs & imaging results that were available during my care of the patient were reviewed by me and considered in my medical decision making (see  chart for details).     21 y.o. female here with L ear pain x2.5 wks and tinnitus/diminished hearing. On exam, bilateral cerumen impactions noted, with the L side having the impaction deep within the EAC. Will use colace to try to flush the ear then reassess shortly.   3:47 PM After 2 irrigations done by nursing staff, cerumen slightly softer; able to get small amount out with curette, however remainder of cerumen too deep in canal; attempted to grasp it but I was unable to remove it because it was painful to the pt and I was concerned with getting too close to the TM. I therefore opted to stop attempting to remove the cerumen in order to avoid risking TM perf. Will send home with debrox drops to use in both ears, advised tylenol/motrin for pain, and f/up with ENT in 1wk for recheck and ongoing management of recurrent cerumen impactions and tinnitus. I explained the diagnosis and have given explicit precautions to return to the ER including for any other new or worsening symptoms. The patient understands and accepts the medical plan as it's been dictated and I have answered their questions. Discharge instructions concerning home care and prescriptions have been given. The patient is STABLE and is  discharged to home in good condition.    Final Clinical Impressions(s) / ED Diagnoses   Final diagnoses:  Bilateral impacted cerumen  Otalgia of left ear    ED Discharge Orders        Ordered    carbamide peroxide (DEBROX) 6.5 % OTIC solution  2 times daily     04/29/17 [address], [place], [place] 04/29/17 1549    Tanna Furry, MD 05/05/17 2303

## 2017-07-04 ENCOUNTER — Ambulatory Visit: Payer: No Typology Code available for payment source

## 2017-07-04 ENCOUNTER — Ambulatory Visit: Payer: Self-pay

## 2017-07-05 ENCOUNTER — Ambulatory Visit: Payer: Self-pay

## 2017-08-20 ENCOUNTER — Emergency Department (HOSPITAL_COMMUNITY): Payer: Self-pay

## 2017-08-20 ENCOUNTER — Emergency Department (HOSPITAL_COMMUNITY)
Admission: EM | Admit: 2017-08-20 | Discharge: 2017-08-20 | Disposition: A | Discharge status: Home / Self Care | Payer: Self-pay | Attending: Emergency Medicine | Admitting: Emergency Medicine

## 2017-08-20 ENCOUNTER — Encounter (HOSPITAL_COMMUNITY): Payer: Self-pay | Admitting: Emergency Medicine

## 2017-08-20 DIAGNOSIS — F17210 Nicotine dependence, cigarettes, uncomplicated: Secondary | ICD-10-CM | POA: Insufficient documentation

## 2017-08-20 DIAGNOSIS — R21 Rash and other nonspecific skin eruption: Secondary | ICD-10-CM | POA: Insufficient documentation

## 2017-08-20 DIAGNOSIS — M79642 Pain in left hand: Secondary | ICD-10-CM | POA: Insufficient documentation

## 2017-08-20 MED ORDER — CLOTRIMAZOLE 1 % EX CREA: TOPICAL_CREAM | CUTANEOUS | 0 refills | Status: DC

## 2017-08-20 MED ORDER — NAPROXEN 500 MG PO TABS: 500.0000 mg | ORAL_TABLET | Freq: Two times a day (BID) | ORAL | 0 refills | Status: DC | PRN

## 2017-08-20 NOTE — ED Triage Notes (Signed)
Pt c/o L hand pain, anterior hand radiating to L index finger, pt reports swelling and pain x 3 weeks. Pt denies injury. States she is R handed.

## 2017-08-20 NOTE — ED Triage Notes (Signed)
Pt states she has been taking Advil 800mg  OTC.

## 2017-08-20 NOTE — ED Provider Notes (Signed)
[facility] DEPT Provider Note   CSN: [id number] Arrival date & time: 08/20/17  1001     History   Chief Complaint Chief Complaint  Patient presents with  . Hand Pain    L hand    HPI Sheri Parker is a 22 y.o. female with a hx of tobacco abuse who presents to the ED with complaint of L hand pain x 3.5 weeks. Patient states pain is constant and progressively worsening. States pain occurs to the entire hand, most prominently to the 2nd digit. Rates the pain a 9/10 in severity, worse with movement of the digits/hand, has tried 800 mg ibuprofen with some relief. States it is at times a burning/tingling pain, sometimes achy. Patient states hand intermittently swells. No injury or change in activity. A couple days noted a rash to the webspace between 2nd-3rd digits, believes she has been in contact with ring worm. Denies numbness, weakness, fever, or chills. Patient is R hand dominant.   HPI  Past Medical History:  Diagnosis Date  . Pollen allergies     Patient Active Problem List   Diagnosis Date Noted  . 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 Dizzy spells 01/04/2015  . Moderate headache 01/04/2015  . Left ovarian cyst 07/02/2014    Past Surgical History:  Procedure Laterality Date  . EYE MUSCLE SURGERY     x 2 one left and one right - lazy eye  . EYE SURGERY    . LAPAROSCOPIC UNILATERAL SALPINGO OOPHERECTOMY Left 07/02/2014   Procedure: LAPAROSCOPIC LEFT OOPHERECTOMY;  Surgeon: Mora Bellman, MD;  Location: [facility];  Service: Gynecology;  Laterality: Left;  . OVARIAN CYST REMOVAL  12/2015    OB History    Gravida Para Term Preterm AB Living   0 0 0 0 0 0   SAB TAB Ectopic Multiple Live Births   0 0 0 0         Home Medications    Prior to Admission medications   Medication Sig Start Date End Date Taking? Authorizing Provider  carbamide peroxide (DEBROX) 6.5 % OTIC solution Place 5 drops 2 (two) times daily into both ears. x5 days Patient not  taking: Reported on 08/20/2017 04/29/17   Street, [place], PA-C  ibuprofen (CHILD IBUPROFEN) 100 MG/5ML suspension Take 29.5 mLs (590 mg total) by mouth every 6 (six) hours as needed. Patient not taking: Reported on 08/20/2017 02/28/17   Charlesetta Shanks, MD    Family History Family History  Problem Relation Age of Onset  . Diabetes Mother   . Diabetes Father   . Cancer Maternal Grandmother     Social History Social History   Tobacco Use  . Smoking status: Current Every Day Smoker    Types: E-cigarettes  . Smokeless tobacco: Never Used  Substance Use Topics  . Alcohol use: No  . Drug use: No     Allergies   Other   Review of Systems Review of Systems  Constitutional: Negative for chills and fever.  Gastrointestinal: Negative for nausea and vomiting.  Musculoskeletal:       Positive for pain to the L hand  Skin: Positive for rash.  Neurological: Negative for weakness and numbness.     Physical Exam Updated Vital Signs BP 125/87 (BP Location: Left Arm)   Pulse 79   Temp 98.4 F (36.9 C) (Oral)   Resp 16   LMP  (LMP Unknown) Comment: Last depo Febr. 2019  SpO2 100%   Physical Exam  Constitutional: She appears  well-developed and well-nourished. No distress.  HENT:  Head: Normocephalic and atraumatic.  Eyes: Conjunctivae are normal. Right eye exhibits no discharge. Left eye exhibits no discharge.  Cardiovascular:  Pulses:      Radial pulses are 2+ on the right side, and 2+ on the left side.  Musculoskeletal:  Upper extremities: No obvious deformities, appreciable swelling, ecchymosis, or warmth.  Left hand dorsal webspace of second and third digits has a 1 cm diameter well circumscribed rash of mildly erythematous scaly papules.  Otherwise no erythema.  Patient has full range of motion at the wrists, with some discomfort with left wrist fracture flexion. Patient is able to extend at all MCPs/PIPs/DIPs, she is able to flex at all of these joints but there is some  minimal limitation in flexion secondary to pain in all joints of the L hand. She is tender to palpation over the 1st-3rd digits extending to MCPs. There is no snuffbox tenderness. (+) median nerve tinels and (+) Finkelsteins test.   Neurological: She is alert.  Clear speech.  Sensation grossly intact bilateral upper extremities.  Patient is able to perform thumbs up, OK sign, and cross her second and third digits bilaterally.  Difficult to assess grip strength secondary to pain.  Skin: Capillary refill takes less than 2 seconds.  Psychiatric: She has a normal mood and affect. Her behavior is normal. Thought content normal.  Nursing note and vitals reviewed.   ED Treatments / Results  Labs (all labs ordered are listed, but only abnormal results are displayed) Labs Reviewed - No data to display  EKG  EKG Interpretation None       Radiology Dg Hand Complete Left  Result Date: 08/20/2017 CLINICAL DATA:  Three-week history of diffuse left hand swelling and pain especially when attempting to extend the fingers. No known injury. Family history of similar problems in the patient's mother requiring surgery to release tendons. EXAM: LEFT HAND - COMPLETE 3+ VIEW COMPARISON:  Left forearm of February 21, 2015 which included a portion of the hand. FINDINGS: The bones are subjectively adequately mineralized. The joint spaces are well maintained. There is no acute fracture or dislocation. The soft tissues are normal. IMPRESSION: There is no acute or significant chronic bony abnormality of the left hand. Please note that this examination does not evaluate the tendons. If there are clinical concerns of significant abnormalities of the flexor and extensor tendons of the hand, MRI of the hand would be a useful next imaging step. Electronically Signed   By: David  Martinique M.D.   On: 08/20/2017 10:22   Procedures Procedures (including critical care time)  Medications Ordered in ED Medications - No data to  display   Initial Impression / Assessment and Plan / ED Course  I have reviewed the triage vital signs and the nursing notes.  Pertinent labs & imaging results that were available during my care of the patient were reviewed by me and considered in my medical decision making (see chart for details).   Patient presents with L hand pain. Patient is nontoxic appearing, in no apparent distress, vitals are WNL. X-ray negative for fracture/dislocation. 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 She is NVI distally. Will place in thumb spica brace and provide prescription for Naproxen with hand surgery follow up. Additionally with complaint of rash which appears consistent with tinea corporis will treat with topical Lotrimin. I discussed results, treatment plan, need for hand surgery follow-up, and return  precautions with the patient. Provided opportunity for questions, patient confirmed understanding and is in agreement with plan.   Final Clinical Impressions(s) / ED Diagnoses   Final diagnoses:  Left hand pain    ED Discharge Orders        Ordered    naproxen (NAPROSYN) 500 MG tablet  2 times daily PRN     08/20/17 1242    clotrimazole (LOTRIMIN) 1 % cream     08/20/17 1242       Helmi Hechavarria, [place] R, PA-C 08/20/17 1329    Fatima Blank, MD 08/20/17 1936

## 2017-08-20 NOTE — Discharge Instructions (Signed)
You were seen in the emergency department today for pain in your left hand.  The x-ray did not show any fractures or dislocations of the bones in your hand.  Your physical exam was suspicious for a possible inflammation of a tendon in your hand or possible compression of your carpal tunnel leading to compression of the median nerve.  We have placed you in a splint which you may wear as needed. We have given you 2 prescriptions:  - Naproxen- Naproxen is a nonsteroidal anti-inflammatory medication that will help with pain and swelling. Be sure to take this medication as prescribed with food, 1 pill every 12 hours,  It should be taken with food, as it can cause stomach upset, and more seriously, stomach bleeding. Do not take other nonsteroidal anti-inflammatory medications with this such as Advil, Motrin, or Aleve.   - Lotrimin- this is a topical cream to apply to the rash area between your fingers two times per day.   We have given you 2 new medications, be sure to discuss these with your pharmacist in regards to any interactions between your prescribed medications or medications you take over-the-counter.  These medications may have side effects, return to the emergency department or see her primary care for any concerning signs or symptoms.  We have given you information for Dr. Lenon Curt for follow up regarding your hand pain.  Call Dr. Brennan Bailey office to make an appointment in 1 week if your symptoms have persisted.  Return to the emergency department for any new or worsening symptoms or any other concerns you may have.

## 2017-10-12 ENCOUNTER — Emergency Department (HOSPITAL_COMMUNITY): Payer: Self-pay

## 2017-10-12 ENCOUNTER — Other Ambulatory Visit: Payer: Self-pay

## 2017-10-12 ENCOUNTER — Emergency Department (HOSPITAL_COMMUNITY)
Admission: EM | Admit: 2017-10-12 | Discharge: 2017-10-12 | Disposition: A | Discharge status: Home / Self Care | Payer: Self-pay | Attending: Emergency Medicine | Admitting: Emergency Medicine

## 2017-10-12 ENCOUNTER — Encounter (HOSPITAL_COMMUNITY): Payer: Self-pay

## 2017-10-12 DIAGNOSIS — R590 Localized enlarged lymph nodes: Secondary | ICD-10-CM | POA: Insufficient documentation

## 2017-10-12 DIAGNOSIS — Z87891 Personal history of nicotine dependence: Secondary | ICD-10-CM | POA: Insufficient documentation

## 2017-10-12 DIAGNOSIS — J039 Acute tonsillitis, unspecified: Secondary | ICD-10-CM | POA: Insufficient documentation

## 2017-10-12 LAB — CBC WITH DIFFERENTIAL/PLATELET
BASOS ABS: 0 10*3/uL (ref 0.0–0.1)
Basophils Relative: 0 %
EOS PCT: 0 %
Eosinophils Absolute: 0 10*3/uL (ref 0.0–0.7)
HEMATOCRIT: 39.7 % (ref 36.0–46.0)
HEMOGLOBIN: 12.5 g/dL (ref 12.0–15.0)
LYMPHS ABS: 1.4 10*3/uL (ref 0.7–4.0)
Lymphocytes Relative: 12 %
MCH: 22.4 pg — ABNORMAL LOW (ref 26.0–34.0)
MCHC: 31.5 g/dL (ref 30.0–36.0)
MCV: 71.1 fL — ABNORMAL LOW (ref 78.0–100.0)
MONOS PCT: 20 %
Monocytes Absolute: 2.3 10*3/uL — ABNORMAL HIGH (ref 0.1–1.0)
NEUTROS ABS: 7.6 10*3/uL (ref 1.7–7.7)
Neutrophils Relative %: 68 %
Platelets: 186 10*3/uL (ref 150–400)
RBC: 5.58 MIL/uL — AB (ref 3.87–5.11)
RDW: 14.3 % (ref 11.5–15.5)
WBC Morphology: INCREASED
WBC: 11.3 10*3/uL — ABNORMAL HIGH (ref 4.0–10.5)

## 2017-10-12 LAB — BASIC METABOLIC PANEL
ANION GAP: 12 (ref 5–15)
BUN: 7 mg/dL (ref 6–20)
CHLORIDE: 100 mmol/L — AB (ref 101–111)
CO2: 21 mmol/L — AB (ref 22–32)
Calcium: 9 mg/dL (ref 8.9–10.3)
Creatinine, Ser: 0.86 mg/dL (ref 0.44–1.00)
GFR calc non Af Amer: >60 mL/min (ref >60)
GLUCOSE: 147 mg/dL — AB (ref 65–99)
Potassium: 3.9 mmol/L (ref 3.5–5.1)
Sodium: 133 mmol/L — ABNORMAL LOW (ref 135–145)

## 2017-10-12 LAB — GROUP A STREP BY PCR: GROUP A STREP BY PCR: NOT DETECTED

## 2017-10-12 LAB — MONONUCLEOSIS SCREEN: Mono Screen: NEGATIVE

## 2017-10-12 MED ORDER — LIDOCAINE VISCOUS 2 % MT SOLN: 10.0000 mL | OROMUCOSAL | 0 refills | Status: DC | PRN

## 2017-10-12 MED ORDER — DEXAMETHASONE SODIUM PHOSPHATE 10 MG/ML IJ SOLN
10.0000 mg | Freq: Once | INTRAMUSCULAR | Status: AC
  Administered 2017-10-12: 10 mg via INTRAVENOUS
  Filled 2017-10-12: qty 1

## 2017-10-12 MED ORDER — CLINDAMYCIN HCL 300 MG PO CAPS: 300.0000 mg | ORAL_CAPSULE | Freq: Three times a day (TID) | ORAL | 0 refills | Status: DC

## 2017-10-12 MED ORDER — IBUPROFEN 200 MG PO TABS
600.0000 mg | ORAL_TABLET | Freq: Once | ORAL | Status: DC
  Filled 2017-10-12: qty 3

## 2017-10-12 MED ORDER — IOHEXOL 300 MG/ML  SOLN
75.0000 mL | Freq: Once | INTRAMUSCULAR | Status: AC | PRN
  Administered 2017-10-12: 75 mL via INTRAVENOUS

## 2017-10-12 MED ORDER — SODIUM CHLORIDE 0.9 % IJ SOLN
INTRAMUSCULAR | Status: AC
  Filled 2017-10-12: qty 50

## 2017-10-12 MED ORDER — SODIUM CHLORIDE 0.9 % IV SOLN
INTRAVENOUS | Status: AC
  Administered 2017-10-12: 16:00:00 via INTRAVENOUS

## 2017-10-12 MED ORDER — IBUPROFEN 100 MG/5ML PO SUSP
600.0000 mg | Freq: Once | ORAL | Status: AC
  Administered 2017-10-12: 600 mg via ORAL
  Filled 2017-10-12: qty 30

## 2017-10-12 MED ORDER — CLINDAMYCIN PHOSPHATE 900 MG/50ML IV SOLN
900.0000 mg | Freq: Once | INTRAVENOUS | Status: AC
  Administered 2017-10-12: 900 mg via INTRAVENOUS
  Filled 2017-10-12: qty 50

## 2017-10-12 NOTE — ED Triage Notes (Signed)
Patient c/o sore throat, back pain, nasal congestion, and headache x 2 days.

## 2017-10-12 NOTE — ED Provider Notes (Signed)
[facility] DEPT Provider Note   CSN: [id number] Arrival date & time: 10/12/17  1155     History   Chief Complaint Chief Complaint  Patient presents with  . Back Pain  . Headache  . Nasal Congestion  . Sore Throat    HPI Sheri Parker is a 22 y.o. female who presents to the ED with sore throat, fever, body aches and gland swelling that started 3 days ago and has gotten worse. Patient taking OTC medication for congestion and sore throat without relief.  The history is provided by the patient. No language interpreter was used.  Back Pain   Associated symptoms include a fever and headaches. Pertinent negatives include no chest pain.  Headache   Associated symptoms include a fever. Pertinent negatives include no nausea.  Sore Throat  This is a new problem. The current episode started more than 2 days ago. The problem occurs constantly. The problem has been gradually worsening. Associated symptoms include headaches. Pertinent negatives include no chest pain. The symptoms are aggravated by swallowing. Nothing relieves the symptoms.    Past Medical History:  Diagnosis Date  . Pollen allergies     Patient Active Problem List   Diagnosis Date Noted  . 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 Dizzy spells 01/04/2015  . Moderate headache 01/04/2015  . Left ovarian cyst 07/02/2014    Past Surgical History:  Procedure Laterality Date  . EYE MUSCLE SURGERY     x 2 one left and one right - lazy eye  . EYE SURGERY    . LAPAROSCOPIC UNILATERAL SALPINGO OOPHERECTOMY Left 07/02/2014   Procedure: LAPAROSCOPIC LEFT OOPHERECTOMY;  Surgeon: Mora Bellman, MD;  Location: [facility];  Service: Gynecology;  Laterality: Left;  . OVARIAN CYST REMOVAL  12/2015     OB History    Gravida  0   Para  0   Term  0   Preterm  0   AB  0   Living  0     SAB  0   TAB  0   Ectopic  0   Multiple  0   Live Births               Home Medications    Prior to  Admission medications   Medication Sig Start Date End Date Taking? Authorizing Provider  clindamycin (CLEOCIN) 300 MG capsule Take 1 capsule (300 mg total) by mouth 3 (three) times daily. 10/12/17   Ashley Murrain, NP  clotrimazole (LOTRIMIN) 1 % cream Apply to affected area 2 times daily 08/20/17   Petrucelli, Samantha R, PA-C  lidocaine (XYLOCAINE) 2 % solution Use as directed 10 mLs in the mouth or throat every 3 (three) hours as needed for mouth pain. 10/12/17   Ashley Murrain, NP  naproxen (NAPROSYN) 500 MG tablet Take 1 tablet (500 mg total) by mouth 2 (two) times daily as needed. 08/20/17   Petrucelli, Glynda Jaeger, PA-C    Family History Family History  Problem Relation Age of Onset  . Diabetes Mother   . Diabetes Father   . Cancer Maternal Grandmother     Social History Social History   Tobacco Use  . Smoking status: Former Smoker    Types: E-cigarettes  . Smokeless tobacco: Never Used  Substance Use Topics  . Alcohol use: No  . Drug use: No     Allergies   Other   Review of Systems Review of Systems  Constitutional: Positive for chills and fever.  HENT: Positive for congestion and sore throat. Negative for trouble swallowing.   Eyes: Negative for discharge and redness.  Respiratory: Positive for cough.   Cardiovascular: Negative for chest pain.  Gastrointestinal: Negative for nausea.  Musculoskeletal: Positive for back pain.  Skin: Negative for rash.  Neurological: Positive for headaches.  Psychiatric/Behavioral: Negative for confusion.     Physical Exam Updated Vital Signs BP 106/64   Pulse (!) 106   Temp 98.9 F (37.2 C) (Oral)   Resp 18   Ht 5\' 7"  (1.702 m)   Wt 59 kg (130 lb)   SpO2 100%   BMI 20.36 kg/m   Physical Exam  Constitutional: She appears well-developed and well-nourished. No distress.  HENT:  Head: Normocephalic and atraumatic.  Right Ear: Tympanic membrane normal.  Left Ear: Tympanic membrane normal.  Nose: Rhinorrhea present.    Mouth/Throat: Uvula is midline and mucous membranes are normal. Oropharyngeal exudate and posterior oropharyngeal erythema present.  Eyes: EOM are normal.  Neck: Neck supple.  Cardiovascular: Regular rhythm. Tachycardia present.  Pulmonary/Chest: Effort normal and breath sounds normal.  Abdominal: Soft. There is no tenderness.  Musculoskeletal: Normal range of motion.  Lymphadenopathy:    She has cervical adenopathy (large bilateral nodes palpated).  Neurological: She is alert.  Skin: Skin is warm and dry.  Psychiatric: She has a normal mood and affect.  Nursing note and vitals reviewed.  2:50 pm Temp 103, will give ibuprofen. Still awaiting strep screen results.   Strep screen negative.   3:30 pm Dr. Gilford Raid in to see the patient. Will order labs and CT of neck in addition to current orders. Will start IV antibiotics, give decadron and fluids IV   After treatment in the ED patient is feeling much better and someone brought her [employer] that she is eating without difficulty. Patient reports feeling much better and ready to go home.   ED Treatments / Results  Labs (all labs ordered are listed, but only abnormal results are displayed) Labs Reviewed  CBC WITH DIFFERENTIAL/PLATELET - Abnormal; Notable for the following components:      Result Value   WBC 11.3 (*)    RBC 5.58 (*)    MCV 71.1 (*)    MCH 22.4 (*)    Monocytes Absolute 2.3 (*)    All other components within normal limits  BASIC METABOLIC PANEL - Abnormal; Notable for the following components:   Sodium 133 (*)    Chloride 100 (*)    CO2 21 (*)    Glucose, Bld 147 (*)    All other components within normal limits  GROUP A STREP BY PCR  MONONUCLEOSIS SCREEN    Radiology Dg Chest 2 View  Result Date: 10/12/2017 CLINICAL DATA:  High fever and cough for 2 days. EXAM: CHEST - 2 VIEW COMPARISON:  01/15/2017 FINDINGS: The heart size and mediastinal contours are within normal limits. Both lungs are clear. The  visualized skeletal structures are unremarkable. IMPRESSION: No active cardiopulmonary disease. Electronically Signed   By: Earle Gell M.D.   On: 10/12/2017 16:31   Ct Soft Tissue Neck W Contrast  Result Date: 10/12/2017 CLINICAL DATA:  Sore throat, headache, and nasal congestion for 2 days EXAM: CT NECK WITH CONTRAST TECHNIQUE: Multidetector CT imaging of the neck was performed using the standard protocol following the bolus administration of intravenous contrast. CONTRAST:  46mL OMNIPAQUE IOHEXOL 300 MG/ML  SOLN COMPARISON:  01/15/2017 head CT FINDINGS: Pharynx and larynx: Symmetrically enlarged palatine and adenoid tonsils. Salivary glands: No  inflammation, mass, or stone. Thyroid: Normal. Lymph nodes: Non cavitated enlargement of bilateral jugular chain and lateral retropharyngeal lymph nodes. Vascular: Negative Limited intracranial: Negative Visualized orbits: Dysconjugate gaze. Patient has history of "lazy eye" Mastoids and visualized paranasal sinuses: Probable retention cyst in the right maxillary sinus and bilateral maxillary floors. Skeleton: No acute or aggressive finding Upper chest: 3 closely neighboring nodules in the right upper lobe. 4 mm nodule in the left upper lobe. No history of malignancy. IMPRESSION: 1. Tonsillitis and cervical adenitis.  No abscess. 2. Few small pulmonary nodules, clustered on the right, likely infectious/inflammatory. Electronically Signed   By: Monte Fantasia M.D.   On: 10/12/2017 17:48    Procedures Procedures (including critical care time)  Medications Ordered in ED Medications  0.9 %  sodium chloride infusion ( Intravenous Stopped 10/12/17 1833)  ibuprofen (ADVIL,MOTRIN) 100 MG/5ML suspension 600 mg (600 mg Oral Given 10/12/17 1536)  dexamethasone (DECADRON) injection 10 mg (10 mg Intravenous Given 10/12/17 1637)  clindamycin (CLEOCIN) IVPB 900 mg (0 mg Intravenous Stopped 10/12/17 1709)  iohexol (OMNIPAQUE) 300 MG/ML solution 75 mL (75 mLs Intravenous  Contrast Given 10/12/17 1720)     Initial Impression / Assessment and Plan / ED Course  I have reviewed the triage vital signs and the nursing notes. Pt febrile with tonsillar exudate, cervical lymphadenopathy, & dysphagia; diagnosis of bacterial pharyngitis/tonsillits. Treated in the ED with steroids,NSAIDs, and Clindamycin 900 mg IV and IV hydration. Discussed importance of water rehydration. CT without abscess identified. Specific return precautions discussed. Pt able to drink water in ED without difficulty with intact air way. Recommended PCP follow up in 2 days or return to ED sooner for worsening symptoms. Rx Clindamycin.  Final Clinical Impressions(s) / ED Diagnoses   Final diagnoses:  Tonsillitis with exudate  Cervical adenopathy    ED Discharge Orders        Ordered    clindamycin (CLEOCIN) 300 MG capsule  3 times daily     10/12/17 1816    lidocaine (XYLOCAINE) 2 % solution  Every  3 hours PRN     10/12/17 1816       Ashley Murrain, NP 10/13/17 2039    Isla Pence, MD 10/16/17 [phone]

## 2017-10-12 NOTE — Discharge Instructions (Addendum)
Take tylenol and ibuprofen as needed for fever and pain. Follow up with your doctor or follow up with the ENT doctor if symptoms persist. Return here as needed.

## 2018-03-21 ENCOUNTER — Inpatient Hospital Stay (HOSPITAL_COMMUNITY)
Admission: AD | Admit: 2018-03-21 | Discharge: 2018-03-21 | Disposition: A | Discharge status: Home / Self Care | Payer: Self-pay | Source: Ambulatory Visit | Attending: Family Medicine | Admitting: Family Medicine

## 2018-03-21 ENCOUNTER — Encounter (HOSPITAL_COMMUNITY): Payer: Self-pay | Admitting: *Deleted

## 2018-03-21 ENCOUNTER — Other Ambulatory Visit: Payer: Self-pay

## 2018-03-21 DIAGNOSIS — Z833 Family history of diabetes mellitus: Secondary | ICD-10-CM | POA: Insufficient documentation

## 2018-03-21 DIAGNOSIS — Z87891 Personal history of nicotine dependence: Secondary | ICD-10-CM | POA: Insufficient documentation

## 2018-03-21 DIAGNOSIS — B373 Candidiasis of vulva and vagina: Secondary | ICD-10-CM | POA: Insufficient documentation

## 2018-03-21 DIAGNOSIS — R1012 Left upper quadrant pain: Secondary | ICD-10-CM | POA: Insufficient documentation

## 2018-03-21 DIAGNOSIS — Z90721 Acquired absence of ovaries, unilateral: Secondary | ICD-10-CM | POA: Insufficient documentation

## 2018-03-21 DIAGNOSIS — B3731 Acute candidiasis of vulva and vagina: Secondary | ICD-10-CM | POA: Diagnosis present

## 2018-03-21 HISTORY — DX: Other specified health status: Z78.9

## 2018-03-21 LAB — COMPREHENSIVE METABOLIC PANEL
ALT: 12 U/L (ref 0–44)
AST: 17 U/L (ref 15–41)
Albumin: 3.8 g/dL (ref 3.5–5.0)
Alkaline Phosphatase: 41 U/L (ref 38–126)
Anion gap: 7 (ref 5–15)
BUN: 10 mg/dL (ref 6–20)
CHLORIDE: 105 mmol/L (ref 98–111)
CO2: 24 mmol/L (ref 22–32)
Calcium: 8.7 mg/dL — ABNORMAL LOW (ref 8.9–10.3)
Creatinine, Ser: 0.68 mg/dL (ref 0.44–1.00)
GFR calc non Af Amer: >60 mL/min (ref >60)
Glucose, Bld: 84 mg/dL (ref 70–99)
Potassium: 4 mmol/L (ref 3.5–5.1)
SODIUM: 136 mmol/L (ref 135–145)
Total Bilirubin: 0.8 mg/dL (ref 0.3–1.2)
Total Protein: 6.5 g/dL (ref 6.5–8.1)

## 2018-03-21 LAB — CBC WITH DIFFERENTIAL/PLATELET
Basophils Absolute: 0 10*3/uL (ref 0.0–0.1)
Basophils Relative: 1 %
Eosinophils Absolute: 0.1 10*3/uL (ref 0.0–0.7)
Eosinophils Relative: 2 %
HEMATOCRIT: 34.1 % — AB (ref 36.0–46.0)
HEMOGLOBIN: 10.9 g/dL — AB (ref 12.0–15.0)
LYMPHS ABS: 1.9 10*3/uL (ref 0.7–4.0)
LYMPHS PCT: 33 %
MCH: 22.8 pg — AB (ref 26.0–34.0)
MCHC: 32 g/dL (ref 30.0–36.0)
MCV: 71.3 fL — AB (ref 78.0–100.0)
MONO ABS: 0.2 10*3/uL (ref 0.1–1.0)
MONOS PCT: 4 %
NEUTROS ABS: 3.6 10*3/uL (ref 1.7–7.7)
NEUTROS PCT: 60 %
PLATELETS: 181 10*3/uL (ref 150–400)
RBC: 4.78 MIL/uL (ref 3.87–5.11)
RDW: 14.5 % (ref 11.5–15.5)
WBC: 5.9 10*3/uL (ref 4.0–10.5)

## 2018-03-21 LAB — WET PREP, GENITAL
Clue Cells Wet Prep HPF POC: NONE SEEN
Sperm: NONE SEEN
Trich, Wet Prep: NONE SEEN

## 2018-03-21 LAB — URINALYSIS, ROUTINE W REFLEX MICROSCOPIC
BILIRUBIN URINE: NEGATIVE
Glucose, UA: NEGATIVE mg/dL
Ketones, ur: NEGATIVE mg/dL
Nitrite: NEGATIVE
PROTEIN: NEGATIVE mg/dL
SPECIFIC GRAVITY, URINE: 1.021 (ref 1.005–1.030)
pH: 6 (ref 5.0–8.0)

## 2018-03-21 LAB — POCT PREGNANCY, URINE: PREG TEST UR: NEGATIVE

## 2018-03-21 LAB — AMYLASE: AMYLASE: 75 U/L (ref 28–100)

## 2018-03-21 LAB — LIPASE, BLOOD: Lipase: 24 U/L (ref 11–51)

## 2018-03-21 MED ORDER — AZITHROMYCIN 1 G PO PACK
1.0000 g | PACK | Freq: Once | ORAL | Status: AC
  Administered 2018-03-21: 1 g via ORAL
  Filled 2018-03-21: qty 1

## 2018-03-21 MED ORDER — CEFTRIAXONE SODIUM 250 MG IJ SOLR
250.0000 mg | Freq: Once | INTRAMUSCULAR | Status: AC
  Administered 2018-03-21: 250 mg via INTRAMUSCULAR
  Filled 2018-03-21: qty 250

## 2018-03-21 MED ORDER — FLUCONAZOLE 150 MG PO TABS: 150.0000 mg | ORAL_TABLET | Freq: Every day | ORAL | 0 refills | Status: DC

## 2018-03-21 MED ORDER — KETOROLAC TROMETHAMINE 60 MG/2ML IM SOLN
60.0000 mg | INTRAMUSCULAR | Status: AC
  Administered 2018-03-21: 60 mg via INTRAMUSCULAR
  Filled 2018-03-21: qty 2

## 2018-03-21 NOTE — MAU Provider Note (Signed)
History     CSN: [id number]  Arrival date and time: 03/21/18 5456   First Provider Initiated Contact with Patient 03/21/18 [phone]      Chief Complaint  Patient presents with  . Vaginal Discharge  . Abdominal Pain   HPI  Ms.  Sheri Parker is a 22 y.o. year old [place] female who presents to MAU reporting vaginal irritation for a couple of weeks. She now has vaginal discharge and odor now. She had her LT ovary ~3 yrs ago. She is taking BC to help alleviate pain and scarring. She has tried to increase her water and cranberry intake; which kind made it go away. She states she "feels different." She states she is "not really sexually active". Her last SI was 4-5 days ago, but 1.5 months prior to that.  Past Medical History:  Diagnosis Date  . Medical history non-contributory   . Pollen allergies     Past Surgical History:  Procedure Laterality Date  . EYE MUSCLE SURGERY     x 2 one left and one right - lazy eye  . EYE SURGERY    . LAPAROSCOPIC UNILATERAL SALPINGO OOPHERECTOMY Left 07/02/2014   Procedure: LAPAROSCOPIC LEFT OOPHERECTOMY;  Surgeon: Mora Bellman, MD;  Location: [facility];  Service: Gynecology;  Laterality: Left;  . OVARIAN CYST REMOVAL  12/2015    Family History  Problem Relation Age of Onset  . Diabetes Mother   . Diabetes Father   . Cancer Maternal Grandmother     Social History   Tobacco Use  . Smoking status: Former Smoker    Types: E-cigarettes  . Smokeless tobacco: Never Used  Substance Use Topics  . Alcohol use: No  . Drug use: No    Allergies:  Allergies  Allergen Reactions  . Other     Seasonal Allergies    No medications prior to admission.    Review of Systems  Constitutional: Negative.   HENT: Negative.   Eyes: Negative.   Respiratory: Negative.   Cardiovascular: Negative.   Gastrointestinal: Positive for abdominal pain (LUQ).  Endocrine: Negative.   Genitourinary: Positive for vaginal discharge (and odor).  Musculoskeletal:  Negative.   Skin: Negative.   Allergic/Immunologic: Negative.   Neurological: Negative.   Hematological: Negative.   Psychiatric/Behavioral: Negative.    Physical Exam   Blood pressure 114/79, pulse 64, temperature 98.6 F (37 C), temperature source Oral, resp. rate 16, weight 52.8 kg, SpO2 100 %.  Physical Exam  Nursing note and vitals reviewed. Constitutional: She is oriented to person, place, and time. She appears well-developed and well-nourished.  HENT:  Head: Normocephalic and atraumatic.  Eyes: Pupils are equal, round, and reactive to light.  Neck: Normal range of motion.  Cardiovascular: Normal rate, regular rhythm and normal heart sounds.  Respiratory: Effort normal and breath sounds normal.  GI: Soft. Bowel sounds are normal. There is tenderness in the left upper quadrant. There is no rebound and no guarding.  Mild LLQ pain with palpation  Genitourinary:  Genitourinary Comments: Uterus: non-tender, SE: cervix is smooth, pink, no lesions, moderate amt of thick, clumpy, white vaginal d/c -- WP, GC/CT done, closed/long/firm, no CMT or friability, no adnexal tenderness, LT ovary surgically absent  Musculoskeletal: Normal range of motion.  Neurological: She is alert and oriented to person, place, and time.  Skin: Skin is warm and dry.  Psychiatric: She has a normal mood and affect. Her behavior is normal. Judgment and thought content normal.    MAU Course  Procedures  MDM CCUA UPT CBC w/Diff CMP Lipase Amylase Wet Prep GC/CT -- pending --> tx for GC/CT given per pt request she states "If they are (+) I will not have time or transportation to get back over here to be tx'd. I would rather just be safe and get tx'd now." Toradol 60 mg -- pain resolved Rocephin 250 mg IM Azithromax 1 gram powder (per pt request)  Results for orders placed or performed during the hospital encounter of 03/21/18 (from the past 48 hour(s))  Urinalysis, Routine w reflex microscopic      Status: Abnormal   Collection Time: 03/21/18  7:36 AM  Result Value Ref Range   Color, Urine YELLOW YELLOW   APPearance HAZY (A) CLEAR   Specific Gravity, Urine 1.021 1.005 - 1.030   pH 6.0 5.0 - 8.0   Glucose, UA NEGATIVE NEGATIVE mg/dL   Hgb urine dipstick SMALL (A) NEGATIVE   Bilirubin Urine NEGATIVE NEGATIVE   Ketones, ur NEGATIVE NEGATIVE mg/dL   Protein, ur NEGATIVE NEGATIVE mg/dL   Nitrite NEGATIVE NEGATIVE   Leukocytes, UA LARGE (A) NEGATIVE   RBC / HPF 6-10 0 - 5 RBC/hpf   WBC, UA 21-50 0 - 5 WBC/hpf   Bacteria, UA RARE (A) NONE SEEN   Squamous Epithelial / LPF 6-10 0 - 5   Mucus PRESENT     Comment: Performed at [facility], [address], [place], [place] [address]  Pregnancy, urine POC     Status: None   Collection Time: 03/21/18  7:45 AM  Result Value Ref Range   Preg Test, Ur NEGATIVE NEGATIVE    Comment:        THE SENSITIVITY OF THIS METHODOLOGY IS >24 mIU/mL   Wet prep, genital     Status: Abnormal   Collection Time: 03/21/18  8:59 AM  Result Value Ref Range   Yeast Wet Prep HPF POC PRESENT (A) NONE SEEN   Trich, Wet Prep NONE SEEN NONE SEEN   Clue Cells Wet Prep HPF POC NONE SEEN NONE SEEN   WBC, Wet Prep HPF POC MANY (A) NONE SEEN    Comment: MANY BACTERIA SEEN   Sperm NONE SEEN     Comment: Performed at [facility], [address], [place], [place] [address]  CBC with Differential/Platelet     Status: Abnormal   Collection Time: 03/21/18  9:53 AM  Result Value Ref Range   WBC 5.9 4.0 - 10.5 K/uL   RBC 4.78 3.87 - 5.11 MIL/uL   Hemoglobin 10.9 (L) 12.0 - 15.0 g/dL   HCT 34.1 (L) 36.0 - 46.0 %   MCV 71.3 (L) 78.0 - 100.0 fL   MCH 22.8 (L) 26.0 - 34.0 pg   MCHC 32.0 30.0 - 36.0 g/dL   RDW 14.5 11.5 - 15.5 %   Platelets 181 150 - 400 K/uL   Neutrophils Relative % 60 %   Neutro Abs 3.6 1.7 - 7.7 K/uL   Lymphocytes Relative 33 %   Lymphs Abs 1.9 0.7 - 4.0 K/uL   Monocytes Relative 4 %   Monocytes Absolute 0.2 0.1 - 1.0 K/uL    Eosinophils Relative 2 %   Eosinophils Absolute 0.1 0.0 - 0.7 K/uL   Basophils Relative 1 %   Basophils Absolute 0.0 0.0 - 0.1 K/uL    Comment: Performed at [facility], [address], [place], [place] [address]  Comprehensive metabolic panel     Status: Abnormal   Collection Time: 03/21/18  9:53 AM  Result  Value Ref Range   Sodium 136 135 - 145 mmol/L   Potassium 4.0 3.5 - 5.1 mmol/L   Chloride 105 98 - 111 mmol/L   CO2 24 22 - 32 mmol/L   Glucose, Bld 84 70 - 99 mg/dL   BUN 10 6 - 20 mg/dL   Creatinine, Ser 0.68 0.44 - 1.00 mg/dL   Calcium 8.7 (L) 8.9 - 10.3 mg/dL   Total Protein 6.5 6.5 - 8.1 g/dL   Albumin 3.8 3.5 - 5.0 g/dL   AST 17 15 - 41 U/L   ALT 12 0 - 44 U/L   Alkaline Phosphatase 41 38 - 126 U/L   Total Bilirubin 0.8 0.3 - 1.2 mg/dL   GFR calc non Af Amer >60 >60 mL/min   GFR calc Af Amer >60 >60 mL/min    Comment: (NOTE) The eGFR has been calculated using the CKD EPI equation. This calculation has not been validated in all clinical situations. eGFR's persistently <60 mL/min signify possible Chronic Kidney Disease.    Anion gap 7 5 - 15    Comment: Performed at [facility], [address], [place], [place] [address]  Lipase, blood     Status: None   Collection Time: 03/21/18  9:53 AM  Result Value Ref Range   Lipase 24 11 - 51 U/L    Comment: Performed at [facility], [address], [place], [place] [address]  Amylase     Status: None   Collection Time: 03/21/18  9:53 AM  Result Value Ref Range   Amylase 75 28 - 100 U/L    Comment: Performed at [facility], [address] [place], [place] [address]     Assessment and Plan  Candida vaginitis  - Rx for Diflucan 150 mg 3 day regimen - Information provided on vaginal yeast infection   Left upper quadrant pain  - Information provided on pain with unknown cause  - F/U with [facility] for referral to GI doctor, if pain persists - Discharge patient - Patient verbalized an understanding of the  plan of care and agrees.    Laury Deep, MSN, CNM 03/22/2018, 8:32 AM

## 2018-03-21 NOTE — Discharge Instructions (Signed)
In late February 2020, the [facility] will be moving to the [facility] At that time, the MAU (Maternity Admissions Unit), where you are being seen today, will no longer take care of non-pregnant patients. We strongly encourage you to find a doctor's office before that time, so that you can be seen with any GYN concerns, like vaginal discharge, urinary tract infection, etc.. in a timely manner.  In order to make an office visit more convenient, the Center for [place] at [facility] will be offering evening hours with same-day appointments, walk-in appointments and scheduled appointments available during this time.  Center for [facility] @ [facility] Hours: Monday - 8am - 7:30 pm with walk-in between 4pm- 7:30 pm Tuesday - 8 am - 5 pm (open late and accepting walk-ins from 4pm - 7:30pm) Wednesday - 8 am - 5 pm (open late and accepting walk-ins from 4pm - 7:30pm) Thursday 8 am - 5 pm (starting 03/20/18 we will be open late and accepting walk-ins from 4pm - 7:30pm) Friday 8 am - 5 pm  For an appointment please call the Center for [place] @ [facility] at [phone]  For urgent needs, [facility] is also available for management of urgent GYN complaints such as vaginal discharge or urinary tract infections.

## 2018-03-21 NOTE — MAU Note (Signed)
Took out left ovary about 3 yrs ago. Is on BC to help with pain and scarring.  Has been having some vag irritation for a couple wks.  Increased water and cranberry juice intake, kind of went away. Now has a d/c, no odor, "feels different".

## 2018-03-22 LAB — URINE CULTURE: Special Requests: NORMAL

## 2018-03-24 LAB — GC/CHLAMYDIA PROBE AMP (~~LOC~~) NOT AT ARMC
Chlamydia: NEGATIVE
Neisseria Gonorrhea: NEGATIVE

## 2018-05-30 ENCOUNTER — Emergency Department (HOSPITAL_COMMUNITY)
Admission: EM | Admit: 2018-05-30 | Discharge: 2018-05-30 | Disposition: A | Discharge status: Home / Self Care | Payer: Medicaid Other | Attending: Emergency Medicine | Admitting: Emergency Medicine

## 2018-05-30 ENCOUNTER — Encounter (HOSPITAL_COMMUNITY): Payer: Self-pay

## 2018-05-30 ENCOUNTER — Other Ambulatory Visit: Payer: Self-pay

## 2018-05-30 DIAGNOSIS — G8929 Other chronic pain: Secondary | ICD-10-CM | POA: Insufficient documentation

## 2018-05-30 DIAGNOSIS — M25562 Pain in left knee: Secondary | ICD-10-CM | POA: Insufficient documentation

## 2018-05-30 DIAGNOSIS — Z87891 Personal history of nicotine dependence: Secondary | ICD-10-CM | POA: Insufficient documentation

## 2018-05-30 NOTE — ED Provider Notes (Signed)
[facility] DEPT Provider Note   CSN: [id number] Arrival date & time: 05/30/18  1616     History   Chief Complaint Chief Complaint  Patient presents with  . Knee Pain    HPI Sheri Parker is a 22 y.o. female.  HPI   Patient is a 22 year old female who presents to  the emergency department today complaining of knee pain.  Triage note indicates that patient is complaining of bilateral knee pain however patient only mentions left knee pain to me during evaluation.  She states that she tore her meniscus when she was a child and was told to wear a brace however she states she never did.  She is concerned now because she is afraid to cause further damage and is requesting a knee immobilizer.  She denies any new injuries or trauma.  She states that this pain is chronic and has been present for several years but seems to flareup when it gets cold outside.  States that she has been walking with a limp.  She denies fevers or chills.  Past Medical History:  Diagnosis Date  . Medical history non-contributory   . Pollen allergies     Patient Active Problem List   Diagnosis Date Noted  . 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 Dizzy spells 01/04/2015  . Moderate headache 01/04/2015  . Left ovarian cyst 07/02/2014    Past Surgical History:  Procedure Laterality Date  . EYE MUSCLE SURGERY     x 2 one left and one right - lazy eye  . EYE SURGERY    . LAPAROSCOPIC UNILATERAL SALPINGO OOPHERECTOMY Left 07/02/2014   Procedure: LAPAROSCOPIC LEFT OOPHERECTOMY;  Surgeon: Mora Bellman, MD;  Location: [facility];  Service: Gynecology;  Laterality: Left;  . OVARIAN CYST REMOVAL  12/2015     OB History    Gravida  0   Para  0   Term  0   Preterm  0   AB  0   Living  0     SAB  0   TAB  0   Ectopic  0   Multiple  0   Live Births               Home Medications    Prior to Admission medications   Medication Sig Start  Date End Date Taking? Authorizing Provider  clindamycin (CLEOCIN) 300 MG capsule Take 1 capsule (300 mg total) by mouth 3 (three) times daily. 10/12/17   Ashley Murrain, NP  clotrimazole (LOTRIMIN) 1 % cream Apply to affected area 2 times daily 08/20/17   Petrucelli, Samantha R, PA-C  fluconazole (DIFLUCAN) 150 MG tablet Take 1 tablet (150 mg total) by mouth daily. Take one today, on day 4 (Mon 10/7), and day 7 (Thurs 10/10) 03/21/18   Laury Deep, CNM  lidocaine (XYLOCAINE) 2 % solution Use as directed 10 mLs in the mouth or throat every 3 (three) hours as needed for mouth pain. 10/12/17   Ashley Murrain, NP  naproxen (NAPROSYN) 500 MG tablet Take 1 tablet (500 mg total) by mouth 2 (two) times daily as needed. 08/20/17   Petrucelli, Glynda Jaeger, PA-C    Family History Family History  Problem Relation Age of Onset  . Diabetes Mother   . Diabetes Father   . Cancer Maternal Grandmother     Social History Social History   Tobacco Use  . Smoking status: Former Smoker    Types: E-cigarettes  .  Smokeless tobacco: Never Used  Substance Use Topics  . Alcohol use: No  . Drug use: No     Allergies   Other   Review of Systems Review of Systems  Constitutional: Negative for chills and fever.  Musculoskeletal:       Left knee pain  Skin: Negative for color change and wound.     Physical Exam Updated Vital Signs BP 129/74 (BP Location: Right Arm)   Pulse 66   Temp 98 F (36.7 C) (Oral)   Resp 15   Ht 5\' 7"  (1.702 m)   Wt 54.4 kg   SpO2 97%   BMI 18.79 kg/m   Physical Exam Vitals signs and nursing note reviewed.  Constitutional:      General: She is not in acute distress.    Appearance: She is well-developed.  HENT:     Head: Normocephalic and atraumatic.  Eyes:     Conjunctiva/sclera: Conjunctivae normal.  Neck:     Musculoskeletal: Neck supple.  Cardiovascular:     Rate and Rhythm: Normal rate.  Pulmonary:     Effort: Pulmonary effort is normal.  Musculoskeletal:  Normal range of motion.     Comments: Mild ttp to the superior and inferior aspects of the patella. No medial or lateral joint line ttp. No swelling or joint effusion. Full and painless ROM of the knee. No joint laxity. Walks with normal gait  Skin:    General: Skin is warm and dry.  Neurological:     Mental Status: She is alert.      ED Treatments / Results  Labs (all labs ordered are listed, but only abnormal results are displayed) Labs Reviewed - No data to display  EKG None  Radiology No results found.  Procedures Procedures (including critical care time) SPLINT APPLICATION Date/Time: 9:83 PM Authorized by: Rodney Booze Consent: Verbal consent obtained. Risks and benefits: risks, benefits and alternatives were discussed Consent given by: patient Splint applied by: orthopedic technician Location details: left lower extremity Splint type: knee immobilizer Post-procedure: The splinted body part was neurovascularly unchanged following the procedure. Patient tolerance: Patient tolerated the procedure well with no immediate complications.  Medications Ordered in ED Medications - No data to display   Initial Impression / Assessment and Plan / ED Course  I have reviewed the triage vital signs and the nursing notes.  Pertinent labs & imaging results that were available during my care of the patient were reviewed by me and considered in my medical decision making (see chart for details).      Final Clinical Impressions(s) / ED Diagnoses   Final diagnoses:  Chronic pain of left knee   Pt with left knee pain that is chronic but worsened when weather became cold. H/o same. Reports distant h/o meniscal tear. No new injury or falls. No evidence of septic arthritis or other life/limb threatening emergency that would require further w/u or imaging. Offered knee sleeve however pt declines, statin that she wants a knee immobilizer. Offered crutches and patient declines.  Discussed that knee immobilizer will not heel her meniscus and that she likely needs orthopedic f/u if she truly has a torn meniscus. Referral given. Advised tylenol and antiinflammatories. Return if worse.   ED Discharge Orders    None       Rodney Booze, [place] 05/30/18 1718    Tegeler, Gwenyth Allegra, MD 05/30/18 2352

## 2018-05-30 NOTE — Discharge Instructions (Addendum)
You may alternate taking Tylenol and Ibuprofen as needed for pain control. You may take 400-600 mg of ibuprofen every 6 hours and [phone] mg of Tylenol every 6 hours. Do not exceed 4000 mg of Tylenol daily as this can lead to liver damage. Also, make sure to take Ibuprofen with meals as it can cause an upset stomach. Do not take other NSAIDs while taking Ibuprofen such as (Aleve, Naprosyn, Aspirin, Celebrex, etc) and do not take more than the prescribed dose as this can lead to ulcers and bleeding in your GI tract. You may use warm and cold compresses to help with your symptoms.   Please follow up with your primary doctor within the next 7-10 days for re-evaluation and further treatment of your symptoms. Follow up with orthopedics if your symptoms do not improve.  Please return to the ER sooner if you have any new or worsening symptoms.

## 2018-05-30 NOTE — ED Triage Notes (Signed)
Pt states that she has bilateral knee pain. Pt thinks it was her left meniscus that she tore in 2013. Pt states that she never wore a brace and is concerned. Pt states that she wants to know if she should now. Pt ambulatory to triage.

## 2018-06-18 ENCOUNTER — Encounter (HOSPITAL_COMMUNITY): Payer: Self-pay

## 2018-06-18 ENCOUNTER — Other Ambulatory Visit: Payer: Self-pay

## 2018-06-18 ENCOUNTER — Inpatient Hospital Stay (HOSPITAL_COMMUNITY)
Admission: AD | Admit: 2018-06-18 | Discharge: 2018-06-18 | Disposition: A | Discharge status: Home / Self Care | Payer: Medicaid Other | Attending: Obstetrics and Gynecology | Admitting: Obstetrics and Gynecology

## 2018-06-18 DIAGNOSIS — N764 Abscess of vulva: Secondary | ICD-10-CM | POA: Insufficient documentation

## 2018-06-18 DIAGNOSIS — Z87891 Personal history of nicotine dependence: Secondary | ICD-10-CM | POA: Insufficient documentation

## 2018-06-18 LAB — URINALYSIS, ROUTINE W REFLEX MICROSCOPIC
BILIRUBIN URINE: NEGATIVE
Bacteria, UA: NONE SEEN
GLUCOSE, UA: NEGATIVE mg/dL
Ketones, ur: 20 mg/dL — AB
NITRITE: NEGATIVE
Protein, ur: 30 mg/dL — AB
Specific Gravity, Urine: 1.025 (ref 1.005–1.030)
pH: 5 (ref 5.0–8.0)

## 2018-06-18 LAB — POCT PREGNANCY, URINE: Preg Test, Ur: NEGATIVE

## 2018-06-18 MED ORDER — SULFAMETHOXAZOLE-TRIMETHOPRIM 800-160 MG PO TABS: 1.0000 | ORAL_TABLET | Freq: Two times a day (BID) | ORAL | 1 refills | Status: DC

## 2018-06-18 NOTE — MAU Note (Signed)
Pt states reports vag. Pain due to a boil that she noticed 4 days ago.  Pt states boil "busted while giving urine sample" in MAU.  Pt rates pain 6/10.  Pt denies vag bleeding or dc.

## 2018-06-18 NOTE — Discharge Instructions (Signed)
Skin Abscess  A skin abscess is an infected area on or under your skin that contains a collection of pus and other material. An abscess may also be called a furuncle, carbuncle, or boil. An abscess can occur in or on almost any part of your body. Some abscesses break open (rupture) on their own. Most continue to get worse unless they are treated. The infection can spread deeper into the body and eventually into your blood, which can make you feel ill. Treatment usually involves draining the abscess. What are the causes? An abscess occurs when germs, like bacteria, pass through your skin and cause an infection. This may be caused by:  A scrape or cut on your skin.  A puncture wound through your skin, including a needle injection or insect bite.  Blocked oil or sweat glands.  Blocked and infected hair follicles.  A cyst that forms beneath your skin (sebaceous cyst) and becomes infected. What increases the risk? This condition is more likely to develop in people who:  Have a weak body defense system (immune system).  Have diabetes.  Have dry and irritated skin.  Get frequent injections or use illegal IV drugs.  Have a foreign body in a wound, such as a splinter.  Have problems with their lymph system or veins. What are the signs or symptoms? Symptoms of this condition include:  A painful, firm bump under the skin.  A bump with pus at the top. This may break through the skin and drain. Other symptoms include:  Redness surrounding the abscess site.  Warmth.  Swelling of the lymph nodes (glands) near the abscess.  Tenderness.  A sore on the skin. How is this diagnosed? This condition may be diagnosed based on:  A physical exam.  Your medical history.  A sample of pus. This may be used to find out what is causing the infection.  Blood tests.  Imaging tests, such as an ultrasound, CT scan, or MRI. How is this treated? A small abscess that drains on its own may not  need treatment. Treatment for larger abscesses may include:  Moist heat or heat pack applied to the area several times a day.  A procedure to drain the abscess (incision and drainage).  Antibiotic medicines. For a severe abscess, you may first get antibiotics through an IV and then change to antibiotics by mouth. Follow these instructions at home: Medicines   Take over-the-counter and prescription medicines only as told by your health care provider.  If you were prescribed an antibiotic medicine, take it as told by your health care provider. Do not stop taking the antibiotic even if you start to feel better. Abscess care   If you have an abscess that has not drained, apply heat to the affected area. Use the heat source that your health care provider recommends, such as a moist heat pack or a heating pad. ? Place a towel between your skin and the heat source. ? Leave the heat on for 20-30 minutes. ? Remove the heat if your skin turns bright red. This is especially important if you are unable to feel pain, heat, or cold. You may have a greater risk of getting burned.  Follow instructions from your health care provider about how to take care of your abscess. Make sure you: ? Cover the abscess with a bandage (dressing). ? Change your dressing or gauze as told by your health care provider. ? Wash your hands with soap and water before you change the  dressing or gauze. If soap and water are not available, use hand sanitizer.  Check your abscess every day for signs of a worsening infection. Check for: ? More redness, swelling, or pain. ? More fluid or blood. ? Warmth. ? More pus or a bad smell. General instructions  To avoid spreading the infection: ? Do not share personal care items, towels, or hot tubs with others. ? Avoid making skin contact with other people.  Keep all follow-up visits as told by your health care provider. This is important. Contact a health care provider if you  have:  More redness, swelling, or pain around your abscess.  More fluid or blood coming from your abscess.  Warm skin around your abscess.  More pus or a bad smell coming from your abscess.  A fever.  Muscle aches.  Chills or a general ill feeling. Get help right away if you:  Have severe pain.  See red streaks on your skin spreading away from the abscess. Summary  A skin abscess is an infected area on or under your skin that contains a collection of pus and other material.  A small abscess that drains on its own may not need treatment.  Treatment for larger abscesses may include having a procedure to drain the abscess and taking an antibiotic. This information is not intended to replace advice given to you by your health care provider. Make sure you discuss any questions you have with your health care provider. Document Released: 03/14/2005 Document Revised: 07/18/2017 Document Reviewed: 07/18/2017 Elsevier Interactive Patient Education  [employer]. In late 2019, the [facility] will be moving to the [place]. At that time, the MAU (Maternity Admissions Unit), where you are being seen today, will no longer take care of non-pregnant patients. We strongly encourage you to find a doctor's office before that time, so that you can be seen with any GYN concerns, like vaginal discharge, urinary tract infection, etc.. in a timely manner.  In order to make an office visit more convenient, the [facility] will be offering evening hours with same-day appointments, walk-in appointments and scheduled appointments available during this time.  [facility] @ [facility] Hours: Monday - 8am - 7:30 pm with walk-in between 4pm- 7:30 pm Tuesday - 8 am - 5 pm (starting 09/17/17 we will be open late and accepting walk-ins from 4pm - 7:30pm) Wednesday - 8 am - 5 pm (starting 12/18/17 we will be open late and accepting  walk-ins from 4pm - 7:30pm) Thursday 8 am - 5 pm (starting 03/20/18 we will be open late and accepting walk-ins from 4pm - 7:30pm) Friday 8 am - 5 pm  For an appointment please call the [facility] @ [facility] at [phone]  For urgent needs, [facility] is also available for management of urgent GYN complaints such as vaginal discharge or urinary tract infections.

## 2018-06-18 NOTE — MAU Provider Note (Signed)
Chief Complaint:  Recurrent Skin Infections   First Provider Initiated Contact with Patient 06/18/18 1834      HPI: Sheri Parker is a 23 y.o. [id number] who presents to maternity admissions reporting swelling and pain on right labia.  Quite tender.   It started draining spontaneously tonight.  . She reports no vaginal bleeding, vaginal itching/burning, urinary symptoms, h/a, dizziness, n/v, or fever/chills.    RN Note: Pt states reports vag. Pain due to a boil that she noticed 4 days ago.  Pt states boil "busted while giving urine sample" in MAU.  Pt rates pain 6/10.  Pt denies vag bleeding or dc.    Past Medical History: Past Medical History:  Diagnosis Date  . Medical history non-contributory   . Pollen allergies     Past obstetric history: OB History  Gravida Para Term Preterm AB Living  0 0 0 0 0 0  SAB TAB Ectopic Multiple Live Births  0 0 0 0      Past Surgical History: Past Surgical History:  Procedure Laterality Date  . EYE MUSCLE SURGERY     x 2 one left and one right - lazy eye  . EYE SURGERY    . LAPAROSCOPIC UNILATERAL SALPINGO OOPHERECTOMY Left 07/02/2014   Procedure: LAPAROSCOPIC LEFT OOPHERECTOMY;  Surgeon: Mora Bellman, MD;  Location: [facility];  Service: Gynecology;  Laterality: Left;  . OVARIAN CYST REMOVAL  12/2015    Family History: Family History  Problem Relation Age of Onset  . Diabetes Mother   . Diabetes Father   . Cancer Maternal Grandmother     Social History: Social History   Tobacco Use  . Smoking status: Former Smoker    Types: E-cigarettes  . Smokeless tobacco: Never Used  Substance Use Topics  . Alcohol use: No  . Drug use: No    Allergies:  Allergies  Allergen Reactions  . Other     Seasonal Allergies    Meds:  Facility-Administered Medications Prior to Admission  Medication Dose Route Frequency Provider Last Rate Last Dose  . medroxyPROGESTERone (DEPO-PROVERA) injection 150 mg  150 mg Intramuscular Q90 days Starr Lake, CNM   150 mg at 04/18/17 1621   Medications Prior to Admission  Medication Sig Dispense Refill Last Dose  . clindamycin (CLEOCIN) 300 MG capsule Take 1 capsule (300 mg total) by mouth 3 (three) times daily. 30 capsule 0   . clotrimazole (LOTRIMIN) 1 % cream Apply to affected area 2 times daily 12 g 0   . fluconazole (DIFLUCAN) 150 MG tablet Take 1 tablet (150 mg total) by mouth daily. Take one today, on day 4 (Mon 10/7), and day 7 (Thurs 10/10) 3 tablet 0   . lidocaine (XYLOCAINE) 2 % solution Use as directed 10 mLs in the mouth or throat every 3 (three) hours as needed for mouth pain. 100 mL 0   . naproxen (NAPROSYN) 500 MG tablet Take 1 tablet (500 mg total) by mouth 2 (two) times daily as needed. 30 tablet 0     I have reviewed patient's Past Medical Hx, Surgical Hx, Family Hx, Social Hx, medications and allergies.  ROS:  Review of Systems  Constitutional: Negative for chills and fever.  Respiratory: Negative for shortness of breath.   Genitourinary: Positive for vaginal discharge and vaginal pain (on right labia). Negative for difficulty urinating, flank pain, pelvic pain and vaginal bleeding.  Neurological: Negative for dizziness.   Other systems negative    Physical Exam   Patient  Vitals for the past 24 hrs:  BP Temp Pulse Resp SpO2  06/18/18 1827 119/72 98.7 F (37.1 C) 90 16 100 %   Constitutional: Well-developed, well-nourished female in no acute distress.  Cardiovascular: normal rate and rhythm Respiratory: normal effort, no distress.  GI: Abd soft, non-tender.  Nondistended.  No rebound, No guarding.  MS: Extremities nontender, no edema, normal ROM Neurologic: Alert and oriented x 4.   Grossly nonfocal. GU: Neg CVAT. Skin:  Warm and Dry Psych:  Affect appropriate.  PELVIC EXAM:  Right labia is enlarged and has a small opening toward bottom that is spontaneously draining.  The area around that is fluctuant but the rest of the elongated enlarged  labia is indurated.  I was able to express some purulent drainage but she would not allow me to fully drain it.    Labs: Results for orders placed or performed during the hospital encounter of 06/18/18 (from the past 24 hour(s))  Urinalysis, Routine w reflex microscopic     Status: Abnormal   Collection Time: 06/18/18  6:33 PM  Result Value Ref Range   Color, Urine YELLOW YELLOW   APPearance HAZY (A) CLEAR   Specific Gravity, Urine 1.025 1.005 - 1.030   pH 5.0 5.0 - 8.0   Glucose, UA NEGATIVE NEGATIVE mg/dL   Hgb urine dipstick SMALL (A) NEGATIVE   Bilirubin Urine NEGATIVE NEGATIVE   Ketones, ur 20 (A) NEGATIVE mg/dL   Protein, ur 30 (A) NEGATIVE mg/dL   Nitrite NEGATIVE NEGATIVE   Leukocytes, UA SMALL (A) NEGATIVE   RBC / HPF 0-5 0 - 5 RBC/hpf   WBC, UA 11-20 0 - 5 WBC/hpf   Bacteria, UA NONE SEEN NONE SEEN   Squamous Epithelial / LPF 0-5 0 - 5   Mucus PRESENT   Pregnancy, urine POC     Status: None   Collection Time: 06/18/18  6:35 PM  Result Value Ref Range   Preg Test, Ur NEGATIVE NEGATIVE       Imaging:  No results found.  MAU Course/MDM: I have ordered labs as follows:See above Imaging ordered: none Results reviewed.   Treatments in MAU included none  Discussed need to do warm soaks and try to express fluid when she can.  Will give her some Bactrim to assist in fighting infection.  May need I&D later but there is to much induration now to do it successfully .   Pt stable at time of discharge.  Assessment: Right labial abscess  Plan: Discharge home Recommend Warm soaks Rx sent for Bactrim Ds for infection  Encouraged to return here or to other Urgent Care/ED if she develops worsening of symptoms, increase in pain, fever, or other concerning symptoms.   Hansel Feinstein CNM, MSN Certified Nurse-Midwife 06/18/2018 6:35 PM

## 2018-07-27 ENCOUNTER — Inpatient Hospital Stay (HOSPITAL_COMMUNITY)
Admission: AD | Admit: 2018-07-27 | Discharge: 2018-07-27 | Disposition: A | Discharge status: Home / Self Care | Payer: Medicaid Other | Source: Ambulatory Visit | Attending: Obstetrics and Gynecology | Admitting: Obstetrics and Gynecology

## 2018-07-27 DIAGNOSIS — Z87891 Personal history of nicotine dependence: Secondary | ICD-10-CM | POA: Insufficient documentation

## 2018-07-27 DIAGNOSIS — R109 Unspecified abdominal pain: Secondary | ICD-10-CM | POA: Insufficient documentation

## 2018-07-27 NOTE — MAU Note (Signed)
Sheri Parker is a 23 y.o. here in MAU reporting: states she missed her Depo shot and is having pain. Would like for [place] to give her Depo today.  LMP: didn't get her period while on Depo  Onset of complaint: 2 days  Pain score: 5/10  Vitals:   07/27/18 1605  BP: 115/68  Pulse: 65  Resp: 18  Temp: 98.3 F (36.8 C)      Lab orders placed from triage: none

## 2018-07-27 NOTE — MAU Provider Note (Signed)
Chief Complaint: Abdominal Pain   First Provider Initiated Contact with Patient 07/27/18 1608      SUBJECTIVE HPI: Sheri Parker is a 23 y.o. [id number] who presents to maternity admissions for a depo injection. She states she has been given Depo here in the past and wants to get it again. Unsure of last dose but states she is due for it. She reports cramping "because it's time for my depo."    Past Medical History:  Diagnosis Date  . Medical history non-contributory   . Pollen allergies    Past Surgical History:  Procedure Laterality Date  . EYE MUSCLE SURGERY     x 2 one left and one right - lazy eye  . EYE SURGERY    . LAPAROSCOPIC UNILATERAL SALPINGO OOPHERECTOMY Left 07/02/2014   Procedure: LAPAROSCOPIC LEFT OOPHERECTOMY;  Surgeon: Mora Bellman, MD;  Location: [facility];  Service: Gynecology;  Laterality: Left;  . OVARIAN CYST REMOVAL  12/2015   Social History   Socioeconomic History  . Marital status: Single    Spouse name: Not on file  . Number of children: Not on file  . Years of education: Not on file  . Highest education level: Not on file  Occupational History  . Not on file  Social Needs  . Financial resource strain: Not on file  . Food insecurity:    Worry: Not on file    Inability: Not on file  . Transportation needs:    Medical: Not on file    Non-medical: Not on file  Tobacco Use  . Smoking status: Former Smoker    Types: E-cigarettes  . Smokeless tobacco: Never Used  Substance and Sexual Activity  . Alcohol use: No  . Drug use: No  . Sexual activity: Yes    Birth control/protection: None, Injection  Lifestyle  . Physical activity:    Days per week: Not on file    Minutes per session: Not on file  . Stress: Not on file  Relationships  . Social connections:    Talks on phone: Not on file    Gets together: Not on file    Attends religious service: Not on file    Active member of club or organization: Not on file    Attends meetings of clubs or  organizations: Not on file    Relationship status: Not on file  . Intimate partner violence:    Fear of current or ex partner: Not on file    Emotionally abused: Not on file    Physically abused: Not on file    Forced sexual activity: Not on file  Other Topics Concern  . Not on file  Social History Narrative  . Not on file   No current facility-administered medications on file prior to encounter.    Current Outpatient Medications on File Prior to Encounter  Medication Sig Dispense Refill  . clindamycin (CLEOCIN) 300 MG capsule Take 1 capsule (300 mg total) by mouth 3 (three) times daily. 30 capsule 0  . clotrimazole (LOTRIMIN) 1 % cream Apply to affected area 2 times daily 12 g 0  . fluconazole (DIFLUCAN) 150 MG tablet Take 1 tablet (150 mg total) by mouth daily. Take one today, on day 4 (Mon 10/7), and day 7 (Thurs 10/10) 3 tablet 0  . lidocaine (XYLOCAINE) 2 % solution Use as directed 10 mLs in the mouth or throat every 3 (three) hours as needed for mouth pain. 100 mL 0  . naproxen (NAPROSYN) 500 MG tablet  Take 1 tablet (500 mg total) by mouth 2 (two) times daily as needed. 30 tablet 0  . sulfamethoxazole-trimethoprim (BACTRIM DS,SEPTRA DS) 800-160 MG tablet Take 1 tablet by mouth 2 (two) times daily. 14 tablet 1   Allergies  Allergen Reactions  . Other     Seasonal Allergies    ROS:  Review of Systems  Constitutional: Negative.  Negative for fatigue and fever.  HENT: Negative.   Respiratory: Negative.  Negative for shortness of breath.   Cardiovascular: Negative.  Negative for chest pain.  Gastrointestinal: Negative.  Negative for abdominal pain, constipation, diarrhea, nausea and vomiting.  Genitourinary: Negative.  Negative for dysuria, vaginal bleeding and vaginal discharge.  Neurological: Negative.  Negative for dizziness and headaches.    I have reviewed patient's Past Medical Hx, Surgical Hx, Family Hx, Social Hx, medications and allergies.   Physical Exam    Patient Vitals for the past 24 hrs:  BP Temp Temp src Pulse Resp Height Weight  07/27/18 1605 115/68 98.3 F (36.8 C) Oral 65 18 - -  07/27/18 1602 - - - - - 5\' 7"  (1.702 m) 55.8 kg     MDM Patient denies any concerning symptoms in need of emergent evaluation. Patient informed that we do not routinely give Depo in MAU. Patient advised that she may wait for a room in MAU to evaluate her cramping and patient refused.   Patient informed she may choose to be discharged to seek non-emergent evaluation of her complaint at [facility] walk in clinic, Urgent care or her PCP.   ASSESSMENT MSE Complete  PLAN Discharge patient at her request to seek non-emergent medical care at [facility] walk in clinic or facility of her choice  Erick Colace 07/27/2018 4:09 PM

## 2018-07-28 ENCOUNTER — Other Ambulatory Visit (HOSPITAL_COMMUNITY)
Admission: RE | Admit: 2018-07-28 | Discharge: 2018-07-28 | Disposition: A | Discharge status: Home / Self Care | Payer: Medicaid Other | Source: Ambulatory Visit | Attending: Advanced Practice Midwife | Admitting: Advanced Practice Midwife

## 2018-07-28 ENCOUNTER — Encounter: Payer: Self-pay | Admitting: Advanced Practice Midwife

## 2018-07-28 ENCOUNTER — Ambulatory Visit: Payer: Medicaid Other | Admitting: Advanced Practice Midwife

## 2018-07-28 ENCOUNTER — Ambulatory Visit (INDEPENDENT_AMBULATORY_CARE_PROVIDER_SITE_OTHER): Payer: Medicaid Other | Admitting: Advanced Practice Midwife

## 2018-07-28 VITALS — BP 118/72 | HR 79 | Ht 67.0 in | Wt 124.7 lb

## 2018-07-28 DIAGNOSIS — N898 Other specified noninflammatory disorders of vagina: Secondary | ICD-10-CM

## 2018-07-28 DIAGNOSIS — Z0000 Encounter for general adult medical examination without abnormal findings: Secondary | ICD-10-CM | POA: Insufficient documentation

## 2018-07-28 DIAGNOSIS — Z3042 Encounter for surveillance of injectable contraceptive: Secondary | ICD-10-CM

## 2018-07-28 LAB — POCT PREGNANCY, URINE: PREG TEST UR: NEGATIVE

## 2018-07-28 MED ORDER — MEDROXYPROGESTERONE ACETATE 150 MG/ML IM SUSP
150.0000 mg | Freq: Once | INTRAMUSCULAR | Status: AC
  Administered 2018-07-28: 150 mg via INTRAMUSCULAR

## 2018-07-28 NOTE — Patient Instructions (Signed)

## 2018-07-28 NOTE — Progress Notes (Signed)
error 

## 2018-07-28 NOTE — Progress Notes (Signed)
    Sheri Parker [birth year] [id number]   History:    23 y.o. non-pregnant female for annual gyn exam and Depo-Provera shot.  Past medical history,surgical history, family history and social history were all reviewed and documented in the EPIC chart.  Gynecologic History No LMP recorded. Patient has had an injection. Contraception: Depo-Provera injections Last Pap: N/a. Results were: N/a  Last mammogram: N/a. Results were: N/a  Obstetric History OB History  Gravida Para Term Preterm AB Living  0 0 0 0 0 0  SAB TAB Ectopic Multiple Live Births  0 0 0 0       ROS: A ROS was performed and pertinent positives and negatives are included in the history.  GENERAL: No fevers or chills. HEENT: No change in vision, no earache, sore throat or sinus congestion. NECK: No pain or stiffness. CARDIOVASCULAR: No chest pain or pressure. No palpitations. PULMONARY: No shortness of breath, cough or wheeze. GASTROINTESTINAL: No abdominal pain, nausea, vomiting or diarrhea, melena or bright red blood per rectum. GENITOURINARY: No urinary frequency, urgency, hesitancy or dysuria. MUSCULOSKELETAL: No joint or muscle pain, no back pain, no recent trauma. DERMATOLOGIC: No rash, no itching, no lesions. ENDOCRINE: No polyuria, polydipsia, no heat or cold intolerance. No recent change in weight. HEMATOLOGICAL: No anemia or easy bruising or bleeding. NEUROLOGIC: No headache, seizures, numbness, tingling or weakness. PSYCHIATRIC: No depression, no loss of interest in normal activity or change in sleep pattern.     Exam: chaperone present  BP 118/72   Pulse 79   Ht 5\' 7"  (1.702 m)   Wt 56.6 kg   BMI 19.53 kg/m   Body mass index is 19.53 kg/m.  General appearance : Well developed well nourished female. No acute distress HEENT: Eyes: no retinal hemorrhage or exudates,  Neck supple, trachea midline, no carotid bruits, no thyroidmegaly Lungs: Clear to auscultation, no rhonchi or wheezes, or rib retractions    Heart: Regular rate and rhythm, no murmurs or gallops Breast:Examined in sitting and supine position were symmetrical in appearance, no palpable masses or tenderness,  no skin retraction, no nipple inversion, no nipple discharge, no skin discoloration, no axillary or supraclavicular lymphadenopathy Abdomen: no palpable masses or tenderness, no rebound or guarding Extremities: no edema or skin discoloration or tenderness  Pelvic: Deferred, patient opted for self-swab collection  Assessment/Plan:  23 y.o. female for annual exam - Return to care in 68mo for Depo-Provera injection  Gabriel Carina, Student-MidWife, 5:36 PM 07/28/2018

## 2018-07-30 LAB — CERVICOVAGINAL ANCILLARY ONLY
Bacterial vaginitis: POSITIVE — AB
Candida vaginitis: NEGATIVE
Chlamydia: NEGATIVE
Neisseria Gonorrhea: NEGATIVE
Trichomonas: NEGATIVE

## 2018-08-06 ENCOUNTER — Telehealth: Payer: Self-pay | Admitting: General Practice

## 2018-08-06 MED ORDER — METRONIDAZOLE 500 MG PO TABS: 500.0000 mg | ORAL_TABLET | Freq: Two times a day (BID) | ORAL | 0 refills | Status: DC

## 2018-08-06 NOTE — Telephone Encounter (Signed)
Patient called and left message on nurse voicemail line stating she is returning our phone call.  Called patient & informed her of BV and medication sent to pharmacy. Patient verbalized understanding and asked how she keeps getting these infections. Discussed with patient causes of BV and common prevention recommendations. Patient verbalized understanding & had no questions.

## 2018-08-06 NOTE — Addendum Note (Signed)
Addended by: Marcille Buffy D on: 08/06/2018 08:30 AM   Modules accepted: Orders

## 2018-08-06 NOTE — Telephone Encounter (Signed)
-----   Message from Tresea Mall, CNM sent at 08/06/2018  8:29 AM EST ----- Patient with BV. Please call her. I have sent in rx to her pharmacy.

## 2018-08-06 NOTE — Telephone Encounter (Signed)
Called patient, no answer then message states call cannot be completed at this time. Will send letter.

## 2018-10-06 ENCOUNTER — Telehealth: Payer: Self-pay | Admitting: *Deleted

## 2018-10-06 ENCOUNTER — Telehealth: Payer: Self-pay | Admitting: Family Medicine

## 2018-10-06 NOTE — Telephone Encounter (Signed)
Sheri Parker called and states she is trying to make her next depo appt and cant get thru to appointment line. I informed her I will take her message and send to registration staff to schedule. She voices understanding.

## 2018-10-06 NOTE — Telephone Encounter (Signed)
Called patient to get her scheduled for her next depo injections. Appointment was given and patient verbalized understanding.

## 2018-10-07 ENCOUNTER — Telehealth: Payer: Self-pay | Admitting: Obstetrics and Gynecology

## 2018-10-07 NOTE — Telephone Encounter (Signed)
The patient called in to reschedule the appointment for an afternoon visit.

## 2018-10-14 ENCOUNTER — Ambulatory Visit: Payer: Medicaid Other

## 2018-10-14 ENCOUNTER — Ambulatory Visit (INDEPENDENT_AMBULATORY_CARE_PROVIDER_SITE_OTHER): Payer: Medicaid Other

## 2018-10-14 ENCOUNTER — Other Ambulatory Visit: Payer: Self-pay

## 2018-10-14 DIAGNOSIS — Z3042 Encounter for surveillance of injectable contraceptive: Secondary | ICD-10-CM | POA: Diagnosis not present

## 2018-10-14 MED ORDER — MEDROXYPROGESTERONE ACETATE 150 MG/ML IM SUSP
150.0000 mg | Freq: Once | INTRAMUSCULAR | Status: AC
  Administered 2018-10-14: 150 mg via INTRAMUSCULAR

## 2018-10-14 NOTE — Progress Notes (Signed)
Chart reviewed for nurse visit. Agree with plan of care.   Starr Lake, CNM 10/14/2018 1:33 PM

## 2018-10-14 NOTE — Progress Notes (Signed)
Sheri Parker here for Depo-Provera  Injection.  Injection administered without complication. Patient will return in 3 months for next injection.    Verdell Carmine, RN 10/14/2018  10:46 AM

## 2018-11-04 ENCOUNTER — Other Ambulatory Visit: Payer: Self-pay

## 2018-11-04 ENCOUNTER — Encounter (HOSPITAL_COMMUNITY): Payer: Self-pay | Admitting: Emergency Medicine

## 2018-11-04 ENCOUNTER — Emergency Department (HOSPITAL_COMMUNITY)
Admission: EM | Admit: 2018-11-04 | Discharge: 2018-11-04 | Discharge status: Against Medical Advice | Payer: Medicaid Other | Attending: Emergency Medicine | Admitting: Emergency Medicine

## 2018-11-04 DIAGNOSIS — Z5321 Procedure and treatment not carried out due to patient leaving prior to being seen by health care provider: Secondary | ICD-10-CM | POA: Insufficient documentation

## 2018-11-04 NOTE — ED Notes (Signed)
Pt approached this tech saying that she was going to leave. Armband was cut off and pt left from ED.
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
## 2018-11-07 ENCOUNTER — Encounter (HOSPITAL_COMMUNITY): Payer: Self-pay

## 2018-11-07 ENCOUNTER — Emergency Department (HOSPITAL_COMMUNITY)
Admission: EM | Admit: 2018-11-07 | Discharge: 2018-11-07 | Disposition: A | Discharge status: Home / Self Care | Payer: No Typology Code available for payment source | Attending: Emergency Medicine | Admitting: Emergency Medicine

## 2018-11-07 ENCOUNTER — Other Ambulatory Visit: Payer: Self-pay

## 2018-11-07 ENCOUNTER — Emergency Department (HOSPITAL_COMMUNITY): Payer: No Typology Code available for payment source

## 2018-11-07 DIAGNOSIS — S161XXA Strain of muscle, fascia and tendon at neck level, initial encounter: Secondary | ICD-10-CM | POA: Diagnosis not present

## 2018-11-07 DIAGNOSIS — F17290 Nicotine dependence, other tobacco product, uncomplicated: Secondary | ICD-10-CM | POA: Diagnosis not present

## 2018-11-07 DIAGNOSIS — Y999 Unspecified external cause status: Secondary | ICD-10-CM | POA: Diagnosis not present

## 2018-11-07 DIAGNOSIS — Y92410 Unspecified street and highway as the place of occurrence of the external cause: Secondary | ICD-10-CM | POA: Diagnosis not present

## 2018-11-07 DIAGNOSIS — S098XXA Other specified injuries of head, initial encounter: Secondary | ICD-10-CM | POA: Diagnosis not present

## 2018-11-07 DIAGNOSIS — Y9389 Activity, other specified: Secondary | ICD-10-CM | POA: Diagnosis not present

## 2018-11-07 DIAGNOSIS — S1980XA Other specified injuries of unspecified part of neck, initial encounter: Secondary | ICD-10-CM | POA: Diagnosis present

## 2018-11-07 MED ORDER — KETOROLAC TROMETHAMINE 15 MG/ML IJ SOLN
30.0000 mg | Freq: Once | INTRAMUSCULAR | Status: AC
  Administered 2018-11-07: 30 mg via INTRAMUSCULAR
  Filled 2018-11-07: qty 2
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
## 2018-11-07 NOTE — Discharge Instructions (Addendum)
Please return for any problem. Follow up with your regular care provider as instructed.   Use motrin as instructed - 600 mg taken orally every 8 hours for pain.

## 2018-12-30 ENCOUNTER — Ambulatory Visit (INDEPENDENT_AMBULATORY_CARE_PROVIDER_SITE_OTHER): Payer: Medicaid Other | Admitting: General Practice

## 2018-12-30 ENCOUNTER — Other Ambulatory Visit: Payer: Self-pay

## 2018-12-30 VITALS — BP 116/74 | HR 85 | Ht 67.0 in | Wt 119.0 lb

## 2018-12-30 DIAGNOSIS — Z30013 Encounter for initial prescription of injectable contraceptive: Secondary | ICD-10-CM

## 2018-12-30 DIAGNOSIS — Z3042 Encounter for surveillance of injectable contraceptive: Secondary | ICD-10-CM

## 2018-12-30 NOTE — Progress Notes (Signed)
Sheri Parker here for Depo-Provera  Injection.  Injection administered without complication. Patient will return in 3 months for next injection.  Derinda Late, RN 12/30/2018  9:02 AM

## 2018-12-30 NOTE — Progress Notes (Signed)
Chart reviewed for nurse visit. Agree with plan of care.   Wende Mott, [place] 12/30/2018 9:23 AM

## 2019-03-17 ENCOUNTER — Telehealth: Payer: Self-pay | Admitting: Family Medicine

## 2019-03-17 NOTE — Telephone Encounter (Signed)
Attempted to contact patient about her appointment on 9/30 @ 8:30. No answer and unable to leave a message due to the voicemail not being set up.

## 2019-03-18 ENCOUNTER — Encounter: Payer: Self-pay | Admitting: Family Medicine

## 2019-03-18 ENCOUNTER — Ambulatory Visit (INDEPENDENT_AMBULATORY_CARE_PROVIDER_SITE_OTHER): Payer: Medicaid Other

## 2019-03-18 ENCOUNTER — Other Ambulatory Visit: Payer: Self-pay

## 2019-03-18 VITALS — BP 122/75 | HR 77 | Wt 121.3 lb

## 2019-03-18 DIAGNOSIS — Z3042 Encounter for surveillance of injectable contraceptive: Secondary | ICD-10-CM

## 2019-03-18 DIAGNOSIS — Z30013 Encounter for initial prescription of injectable contraceptive: Secondary | ICD-10-CM | POA: Diagnosis not present

## 2019-03-18 MED ORDER — MEDROXYPROGESTERONE ACETATE 150 MG/ML IM SUSP: 150.0000 mg | Freq: Once | INTRAMUSCULAR | Status: DC

## 2019-03-18 NOTE — Progress Notes (Signed)
Sheri Parker here for Depo-Provera  Injection.  Injection administered without complication. Patient will return in 3 months for next injection.  Annabell Howells, RN 03/18/2019  9:26 AM

## 2019-03-18 NOTE — Progress Notes (Signed)
Agree with A & P. 

## 2019-03-28 ENCOUNTER — Emergency Department (HOSPITAL_COMMUNITY)
Admission: EM | Admit: 2019-03-28 | Discharge: 2019-03-28 | Disposition: A | Discharge status: Home / Self Care | Payer: Medicaid Other | Attending: Emergency Medicine | Admitting: Emergency Medicine

## 2019-03-28 ENCOUNTER — Inpatient Hospital Stay (HOSPITAL_COMMUNITY)
Admission: AD | Admit: 2019-03-28 | Discharge: 2019-03-28 | Discharge status: Against Medical Advice | Payer: Medicaid Other | Attending: Obstetrics & Gynecology | Admitting: Obstetrics & Gynecology

## 2019-03-28 ENCOUNTER — Other Ambulatory Visit: Payer: Self-pay

## 2019-03-28 ENCOUNTER — Encounter (HOSPITAL_COMMUNITY): Payer: Self-pay

## 2019-03-28 DIAGNOSIS — N760 Acute vaginitis: Secondary | ICD-10-CM | POA: Insufficient documentation

## 2019-03-28 DIAGNOSIS — Z87891 Personal history of nicotine dependence: Secondary | ICD-10-CM | POA: Insufficient documentation

## 2019-03-28 DIAGNOSIS — B9689 Other specified bacterial agents as the cause of diseases classified elsewhere: Secondary | ICD-10-CM

## 2019-03-28 DIAGNOSIS — Z3202 Encounter for pregnancy test, result negative: Secondary | ICD-10-CM | POA: Insufficient documentation

## 2019-03-28 LAB — WET PREP, GENITAL
Sperm: NONE SEEN
Trich, Wet Prep: NONE SEEN
WBC, Wet Prep HPF POC: NONE SEEN
Yeast Wet Prep HPF POC: NONE SEEN

## 2019-03-28 LAB — URINALYSIS, ROUTINE W REFLEX MICROSCOPIC
Bacteria, UA: NONE SEEN
Bilirubin Urine: NEGATIVE
Glucose, UA: NEGATIVE mg/dL
Ketones, ur: NEGATIVE mg/dL
Nitrite: NEGATIVE
Protein, ur: NEGATIVE mg/dL
Specific Gravity, Urine: 1.024 (ref 1.005–1.030)
pH: 5 (ref 5.0–8.0)

## 2019-03-28 LAB — POC URINE PREG, ED: Preg Test, Ur: NEGATIVE

## 2019-03-28 LAB — POCT PREGNANCY, URINE: Preg Test, Ur: NEGATIVE

## 2019-03-28 MED ORDER — STERILE WATER FOR INJECTION IJ SOLN
INTRAMUSCULAR | Status: AC
  Filled 2019-03-28: qty 10

## 2019-03-28 MED ORDER — METRONIDAZOLE 0.75 % EX GEL: 1.0000 "application " | Freq: Every day | CUTANEOUS | 0 refills | Status: AC

## 2019-03-28 MED ORDER — AZITHROMYCIN 250 MG PO TABS
1000.0000 mg | ORAL_TABLET | Freq: Once | ORAL | Status: AC
  Administered 2019-03-28: 1000 mg via ORAL
  Filled 2019-03-28: qty 4

## 2019-03-28 MED ORDER — CEFTRIAXONE SODIUM 250 MG IJ SOLR
250.0000 mg | Freq: Once | INTRAMUSCULAR | Status: AC
  Administered 2019-03-28: 16:00:00 250 mg via INTRAMUSCULAR
  Filled 2019-03-28: qty 250

## 2019-03-28 NOTE — Discharge Instructions (Signed)
Use metronidazole gel.  You may insert 1 applicatorful once or twice daily for 5 days.  Please follow-up with your OB/GYN in 1 to 2 weeks for continued evaluation of your recurrent bacterial vaginosis.  Return to the ED immediately for new or worsening symptoms or concerns such as abdominal pain, fevers, vomiting or any concerns at all.

## 2019-03-28 NOTE — MAU Note (Signed)
Sheri Parker is a 23 y.o. here in MAU reporting: pt does not think she is pregnant, informed pt we only see pregnant patients here and patient states she does not wish to proceed with triage and would like to leave. Pt left prior to VS being taken, pain being assessed.   LMP:  On depo shot, does not get period  Lab orders placed from triage: UPT

## 2019-03-28 NOTE — ED Triage Notes (Signed)
Pt presents with c/o vaginal discharge for approx 2 weeks. Pt reports the discharge is white in color, no odor. Pt reports her OBGYN reported she would have some discharge after receiving her BC shot. Pt's last shot was on 9/30.

## 2019-03-28 NOTE — ED Provider Notes (Signed)
[facility] DEPT Provider Note   CSN: [id number] Arrival date & time: 03/28/19  1325     History   Chief Complaint Chief Complaint  Patient presents with  . Vaginal Discharge    HPI Sheri Parker is a 23 y.o. female.     HPI   23 year old female presents with a week and a half history of vaginal discharge.  She describes the discharge as white.  She notes intermittently that she has a foul odor with the discharge which she describes as smelling like a fish.  She notes that she has had some left lower quadrant abdominal pain which she states is in her ovary.  She states that she has not had this within the last 24 to 48 hours.  She notes some vaginal discharge which she describes as scant.  She denies any fevers, chills.  She denies any nausea, vomiting.  She denies any known sexually transmitted diseases.  She denies any dysuria, urinary urgency, urinary frequency. Past Medical History:  Diagnosis Date  . Medical history non-contributory   . Pollen allergies     Patient Active Problem List   Diagnosis Date Noted  . 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 Dizzy spells 01/04/2015  . Moderate headache 01/04/2015  . Left ovarian cyst 07/02/2014    Past Surgical History:  Procedure Laterality Date  . EYE MUSCLE SURGERY     x 2 one left and one right - lazy eye  . EYE SURGERY    . LAPAROSCOPIC UNILATERAL SALPINGO OOPHERECTOMY Left 07/02/2014   Procedure: LAPAROSCOPIC LEFT OOPHERECTOMY;  Surgeon: Mora Bellman, MD;  Location: [facility];  Service: Gynecology;  Laterality: Left;  . OVARIAN CYST REMOVAL  12/2015     OB History    Gravida  0   Para  0   Term  0   Preterm  0   AB  0   Living  0     SAB  0   TAB  0   Ectopic  0   Multiple  0   Live Births               Home Medications    Prior to Admission medications   Medication Sig Start Date End Date Taking? Authorizing Provider  metroNIDAZOLE  (FLAGYL) 500 MG tablet Take 1 tablet (500 mg total) by mouth 2 (two) times daily. 08/06/18   Tresea Mall, CNM    Family History Family History  Problem Relation Age of Onset  . Diabetes Mother   . Diabetes Father   . Cancer Maternal Grandmother     Social History Social History   Tobacco Use  . Smoking status: Former Smoker    Types: E-cigarettes  . Smokeless tobacco: Never Used  Substance Use Topics  . Alcohol use: No  . Drug use: No     Allergies   Other   Review of Systems Review of Systems  Constitutional: Negative for chills and fever.  Respiratory: Negative for shortness of breath.   Cardiovascular: Negative for chest pain.  Gastrointestinal: Negative for abdominal pain, nausea and vomiting.  Genitourinary: Positive for vaginal bleeding and vaginal discharge. Negative for dysuria and frequency.     Physical Exam Updated Vital Signs BP 114/71 (BP Location: Left Arm)   Pulse 81   Temp 98.5 F (36.9 C) (Oral)   Resp 18   SpO2 100%   Physical Exam Vitals signs and nursing note reviewed. Exam conducted with  a chaperone present.  Constitutional:      Appearance: She is well-developed.  HENT:     Head: Normocephalic and atraumatic.  Eyes:     Conjunctiva/sclera: Conjunctivae normal.  Neck:     Musculoskeletal: Neck supple.  Cardiovascular:     Rate and Rhythm: Normal rate and regular rhythm.     Heart sounds: Normal heart sounds. No murmur.  Pulmonary:     Effort: Pulmonary effort is normal. No respiratory distress.     Breath sounds: Normal breath sounds. No wheezing or rales.  Abdominal:     General: Bowel sounds are normal. There is no distension.     Palpations: Abdomen is soft.     Tenderness: There is no abdominal tenderness.  Genitourinary:    General: Normal vulva.     Vagina: Vaginal discharge (clear) and bleeding (scant) present.     Cervix: Normal.  Musculoskeletal: Normal range of motion.        General: No tenderness or  deformity.  Skin:    General: Skin is warm and dry.     Findings: No erythema or rash.  Neurological:     Mental Status: She is alert and oriented to person, place, and time.  Psychiatric:        Behavior: Behavior normal.      ED Treatments / Results  Labs (all labs ordered are listed, but only abnormal results are displayed) Labs Reviewed  WET PREP, GENITAL - Abnormal; Notable for the following components:      Result Value   Clue Cells Wet Prep HPF POC PRESENT (*)    All other components within normal limits  URINALYSIS, ROUTINE W REFLEX MICROSCOPIC - Abnormal; Notable for the following components:   Hgb urine dipstick SMALL (*)    Leukocytes,Ua TRACE (*)    All other components within normal limits  URINE CULTURE  POC URINE PREG, ED  GC/CHLAMYDIA PROBE AMP () NOT AT [facility]    EKG None  Radiology No results found.  Procedures Procedures (including critical care time)  Medications Ordered in ED Medications  sterile water (preservative free) injection (  Not Given 03/28/19 1600)  cefTRIAXone (ROCEPHIN) injection 250 mg (250 mg Intramuscular Given 03/28/19 1556)  azithromycin (ZITHROMAX) tablet 1,000 mg (1,000 mg Oral Given 03/28/19 1553)     Initial Impression / Assessment and Plan / ED Course  I have reviewed the triage vital signs and the nursing notes.  Pertinent labs & imaging results that were available during my care of the patient were reviewed by me and considered in my medical decision making (see chart for details).        Patient presents with vaginal discharge.  She notes some abdominal pain several days ago.  Currently her abdomen is soft and nontender to palpation.  She does have some clear discharge noted and scant amount of bleeding.  Her urine shows she is not pregnant.  Her urine is not consistent with a UTI however was sent for culture.  She wished to be treated phylactic [facility] for gonorrhea and chlamydia.  She was given ceftriaxone  and azithromycin.  Her vaginal swab shows clue cells consistent with bacterial vaginosis.  Patient states a history of this since starting Depakote shots.  She cannot take pills so will discharge with metronidazole gel.  Patient agreeable this plan.  Encourage close follow-up with OB/GYN given her recurrent BV diagnosis.  Vital signs stable.  Patient ready and stable for discharge   At this time  there does not appear to be any evidence of an acute emergency medical condition and the patient appears stable for discharge with appropriate outpatient follow up.Diagnosis was discussed with patient who verbalizes understanding and is agreeable to discharge.  Final Clinical Impressions(s) / ED Diagnoses   Final diagnoses:  None    ED Discharge Orders    None       Rachel Moulds 03/28/19 1701    Virgel Manifold, MD 03/28/19 1726

## 2019-03-29 LAB — URINE CULTURE: Culture: NO GROWTH

## 2019-03-30 LAB — GC/CHLAMYDIA PROBE AMP (~~LOC~~) NOT AT ARMC
Chlamydia: NEGATIVE
Neisseria Gonorrhea: NEGATIVE

## 2019-06-03 ENCOUNTER — Ambulatory Visit: Payer: Medicaid Other

## 2019-06-18 ENCOUNTER — Ambulatory Visit (INDEPENDENT_AMBULATORY_CARE_PROVIDER_SITE_OTHER): Payer: Medicaid Other

## 2019-06-18 ENCOUNTER — Other Ambulatory Visit: Payer: Self-pay

## 2019-06-18 VITALS — BP 121/68 | HR 79 | Wt 120.5 lb

## 2019-06-18 DIAGNOSIS — Z3202 Encounter for pregnancy test, result negative: Secondary | ICD-10-CM | POA: Diagnosis not present

## 2019-06-18 DIAGNOSIS — Z3042 Encounter for surveillance of injectable contraceptive: Secondary | ICD-10-CM | POA: Diagnosis not present

## 2019-06-18 DIAGNOSIS — Z30013 Encounter for initial prescription of injectable contraceptive: Secondary | ICD-10-CM

## 2019-06-18 DIAGNOSIS — Z3200 Encounter for pregnancy test, result unknown: Secondary | ICD-10-CM

## 2019-06-18 LAB — POCT PREGNANCY, URINE: Preg Test, Ur: NEGATIVE

## 2019-06-18 MED ORDER — MEDROXYPROGESTERONE ACETATE 150 MG/ML IM SUSP: 150.0000 mg | Freq: Once | INTRAMUSCULAR | Status: DC

## 2019-06-18 NOTE — Progress Notes (Signed)
Sheri Parker here for Depo-Provera  Injection. Injection window 12/16-12/30; pregnancy test negative today. Injection administered without complication. Patient will return in 3 months for next injection.  Annabell Howells, RN 06/18/2019  9:09 AM

## 2019-06-26 NOTE — Progress Notes (Signed)
I have reviewed this chart and agree with the RN/CMA assessment and management.    Dreama Kuna C Angelica Wix, MD, FACOG Attending Physician, [facility]  

## 2019-06-27 ENCOUNTER — Emergency Department (HOSPITAL_COMMUNITY)
Admission: EM | Admit: 2019-06-27 | Discharge: 2019-06-27 | Disposition: A | Discharge status: Home / Self Care | Payer: Medicaid Other | Attending: Emergency Medicine | Admitting: Emergency Medicine

## 2019-06-27 ENCOUNTER — Encounter (HOSPITAL_COMMUNITY): Payer: Self-pay | Admitting: Emergency Medicine

## 2019-06-27 ENCOUNTER — Other Ambulatory Visit: Payer: Self-pay

## 2019-06-27 DIAGNOSIS — M79645 Pain in left finger(s): Secondary | ICD-10-CM | POA: Insufficient documentation

## 2019-06-27 DIAGNOSIS — Z87891 Personal history of nicotine dependence: Secondary | ICD-10-CM | POA: Insufficient documentation

## 2019-06-27 DIAGNOSIS — M25532 Pain in left wrist: Secondary | ICD-10-CM | POA: Insufficient documentation

## 2019-06-27 MED ORDER — IBUPROFEN 600 MG PO TABS: 600.0000 mg | ORAL_TABLET | Freq: Four times a day (QID) | ORAL | 0 refills | Status: AC | PRN

## 2019-06-27 NOTE — ED Notes (Signed)
Applied rhyno lacer wrist and thumb support

## 2019-06-27 NOTE — Discharge Instructions (Addendum)
Your symptoms are most consistent with either de Quervain's tenosynovitis or carpal tunnel.  For both diseases symptoms are usually well controlled with ibuprofen and a wrist plan.

## 2019-06-27 NOTE — ED Triage Notes (Signed)
Pt c/o of left index finger pains and intermittent left hand numbness for 3 years.

## 2019-06-27 NOTE — ED Provider Notes (Signed)
[facility] DEPT Provider Note   CSN: [id number] Arrival date & time: 06/27/19  [id number]     History Chief Complaint  Patient presents with  . Hand Pain    Sheri Parker is a 24 y.o. female.  HPI  Patient is a pleasant female with no significant past medical history presented today for 3 years of left wrist pain and second finger pain.  Her symptoms are intermittent, severe, worse in the morning and after work.  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  She does have significant repetitive motions.  Denies any consistent typing at work.  Denies any history of neurological disease in her personal history or with her family.  No history of strokes or MS.  Patient denies any other complaints at this time.  Denies any fevers, chills, headache, dizziness.  Denies any weakness in any areas of her body.  Patient states that her symptoms will completely resolve at times and states that Tylenol seems to help somewhat.  She has never seen a medical provider for this in the past.     Past Medical History:  Diagnosis Date  . Medical history non-contributory   . Pollen allergies     Patient Active Problem List   Diagnosis Date Noted  . 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 Dizzy spells 01/04/2015  . Moderate headache 01/04/2015  . Left ovarian cyst 07/02/2014    Past Surgical History:  Procedure Laterality Date  . EYE MUSCLE SURGERY     x 2 one left and one right - lazy eye  . EYE SURGERY    . LAPAROSCOPIC UNILATERAL SALPINGO OOPHERECTOMY Left 07/02/2014   Procedure: LAPAROSCOPIC LEFT OOPHERECTOMY;  Surgeon: Mora Bellman, MD;  Location: [facility];  Service: Gynecology;  Laterality: Left;  . OVARIAN CYST REMOVAL  12/2015     OB History    Gravida  0   Para  0   Term  0   Preterm  0   AB  0   Living  0     SAB  0   TAB  0   Ectopic  0   Multiple  0   Live Births                Family History  Problem Relation Age of Onset  . Diabetes Mother   . Diabetes Father   . Cancer Maternal Grandmother     Social History   Tobacco Use  . Smoking status: Former Smoker    Types: E-cigarettes  . Smokeless tobacco: Never Used  Substance Use Topics  . Alcohol use: No  . Drug use: No    Home Medications Prior to Admission medications   Medication Sig Start Date End Date Taking? Authorizing Provider  ibuprofen (ADVIL) 600 MG tablet Take 1 tablet (600 mg total) by mouth every 6 (six) hours as needed for up to 5 days. 06/27/19 07/02/19  Tedd Sias, PA  metroNIDAZOLE (FLAGYL) 500 MG tablet Take 1 tablet (500 mg total) by mouth 2 (two) times daily. Patient not taking: Reported on 06/27/2019 08/06/18   Tresea Mall, CNM    Allergies    Other  Review of Systems   Review of Systems  Constitutional: Negative for fever.  HENT: Negative for congestion.   Respiratory: Negative for shortness of breath.   Cardiovascular: Negative for chest pain.  Gastrointestinal: Negative for abdominal distention.  Musculoskeletal:  Left second finger pain, left wrist pain  Neurological: Negative for dizziness and headaches.    Physical Exam Updated Vital Signs BP 120/67 (BP Location: Left Arm)   Pulse 97   Temp 98.6 F (37 C) (Oral)   Resp 18   SpO2 100%   Physical Exam Vitals and nursing note reviewed.  Constitutional:      General: She is not in acute distress.    Appearance: Normal appearance. She is not ill-appearing or toxic-appearing.  HENT:     Head: Normocephalic and atraumatic.  Eyes:     General: No scleral icterus.       Right eye: No discharge.        Left eye: No discharge.     Conjunctiva/sclera: Conjunctivae normal.  Cardiovascular:     Rate and Rhythm: Normal rate and regular rhythm.     Pulses: Normal pulses.     Comments: Pulses symmetric BUE Pulmonary:     Effort: Pulmonary effort is normal.     Breath sounds: No stridor.   Musculoskeletal:     Comments: Full range of motion of bilateral wrists and all fingers.  Grip strength 5/5 bilateral upper extremities.  Some tenderness over the 2nd finger diffusely. Some tenderness over the wrist.   Skin:    General: Skin is warm and dry.     Capillary Refill: Capillary refill takes less than 2 seconds.     Coloration: Skin is not jaundiced or pale.     Findings: No bruising or erythema.     Comments: No bruising, abrasion, laceration ecchymosis  Neurological:     Mental Status: She is alert and oriented to person, place, and time. Mental status is at baseline.     ED Results / Procedures / Treatments   Labs (all labs ordered are listed, but only abnormal results are displayed) Labs Reviewed - No data to display  EKG None  Radiology No results found.  Procedures Procedures (including critical care time)  Medications Ordered in ED Medications - No data to display  ED Course  I have reviewed the triage vital signs and the nursing notes.  Pertinent labs & imaging results that were available during my care of the patient were reviewed by me and considered in my medical decision making (see chart for details).    MDM Rules/Calculators/A&P                      Patient is a 24 year old female presented today with left wrist pain and hand tingling in her second finger for the last 3 years.  Patient states her symptoms are intermittent.  Seem to happen mostly at night.  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  Doubt MS, doubt stroke, doubt demyelinating disease.  Patient is a symptoms for 3 years.  They appear to be worse at night.  Will prescribe patient with splint and ibuprofen.  Patient given follow-up with orthopedics if symptoms persist.  Physical exam with positive typical signs and Phalen's.  Some tenderness over the finger.  Doubt fracture as patient has no point tenderness and  asked for admission of hands.  Will recommend follow-up with orthopedics if symptoms persist.  The medical records were personally reviewed by myself. I personally reviewed all lab results and interpreted all imaging studies and either concurred with their official read or contacted radiology for clarification.   This patient appears reasonably screened and I doubt any  other medical condition requiring further workup, evaluation, or treatment in the ED at this time prior to discharge.   Patient's vitals are WNL apart from vital sign abnormalities discussed above, patient is in NAD, and able to ambulate in the ED at their baseline and able to tolerate PO.  Pain has been managed or a plan has been made for home management and has no complaints prior to discharge. Patient is comfortable with above plan and for discharge at this time. All questions were answered prior to disposition. Results from the ER workup discussed with the patient face to face and all questions answered to the best of my ability. The patient is safe for discharge with strict return precautions. Patient appears safe for discharge with appropriate follow-up. Conveyed my impression with the patient and they voiced understanding and are agreeable to plan.   An After Visit Summary was printed and given to the patient.  Portions of this note were generated with [employer]. Dictation errors may occur despite best attempts at proofreading.    Final Clinical Impression(s) / ED Diagnoses Final diagnoses:  Left wrist pain  Pain of finger of left hand    Rx / DC Orders ED Discharge Orders         Ordered    ibuprofen (ADVIL) 600 MG tablet  Every 6 hours PRN     06/27/19 0947           Tedd Sias, PA 06/27/19 [id number]    Lacretia Leigh, MD 06/28/19 1430

## 2019-09-04 ENCOUNTER — Other Ambulatory Visit: Payer: Self-pay

## 2019-09-04 ENCOUNTER — Ambulatory Visit (INDEPENDENT_AMBULATORY_CARE_PROVIDER_SITE_OTHER): Payer: Self-pay

## 2019-09-04 VITALS — BP 114/69 | HR 54 | Wt 124.1 lb

## 2019-09-04 DIAGNOSIS — Z3042 Encounter for surveillance of injectable contraceptive: Secondary | ICD-10-CM

## 2019-09-04 MED ORDER — MEDROXYPROGESTERONE ACETATE 150 MG/ML IM SUSP
150.0000 mg | Freq: Once | INTRAMUSCULAR | Status: AC
  Administered 2019-09-04: 150 mg via INTRAMUSCULAR

## 2019-09-04 NOTE — Progress Notes (Signed)
Sheri Parker here for Depo-Provera  Injection.  Injection administered without complication. Patient will return in 3 months for next injection.  Verdell Carmine, RN 09/04/2019  9:08 AM

## 2019-09-04 NOTE — Progress Notes (Signed)
Patient seen and assessed by nursing staff during this encounter. I have reviewed the chart and agree with the documentation and plan.  Verita Schneiders, MD 09/04/2019 11:55 AM

## 2019-09-20 ENCOUNTER — Emergency Department (HOSPITAL_COMMUNITY)
Admission: EM | Admit: 2019-09-20 | Discharge: 2019-09-20 | Disposition: A | Discharge status: Home / Self Care | Payer: Medicaid Other | Attending: Emergency Medicine | Admitting: Emergency Medicine

## 2019-09-20 ENCOUNTER — Encounter (HOSPITAL_COMMUNITY): Payer: Self-pay | Admitting: Emergency Medicine

## 2019-09-20 ENCOUNTER — Other Ambulatory Visit: Payer: Self-pay

## 2019-09-20 DIAGNOSIS — Z113 Encounter for screening for infections with a predominantly sexual mode of transmission: Secondary | ICD-10-CM | POA: Insufficient documentation

## 2019-09-20 DIAGNOSIS — B9689 Other specified bacterial agents as the cause of diseases classified elsewhere: Secondary | ICD-10-CM

## 2019-09-20 DIAGNOSIS — N760 Acute vaginitis: Secondary | ICD-10-CM | POA: Insufficient documentation

## 2019-09-20 DIAGNOSIS — Z711 Person with feared health complaint in whom no diagnosis is made: Secondary | ICD-10-CM

## 2019-09-20 DIAGNOSIS — Z87891 Personal history of nicotine dependence: Secondary | ICD-10-CM | POA: Insufficient documentation

## 2019-09-20 DIAGNOSIS — Z79899 Other long term (current) drug therapy: Secondary | ICD-10-CM | POA: Insufficient documentation

## 2019-09-20 LAB — WET PREP, GENITAL
Sperm: NONE SEEN
Trich, Wet Prep: NONE SEEN
Yeast Wet Prep HPF POC: NONE SEEN

## 2019-09-20 LAB — POC URINE PREG, ED: Preg Test, Ur: NEGATIVE

## 2019-09-20 MED ORDER — DOXYCYCLINE CALCIUM 50 MG/5ML PO SYRP: 100.0000 mg | ORAL_SOLUTION | Freq: Two times a day (BID) | ORAL | 0 refills | Status: AC

## 2019-09-20 MED ORDER — METRONIDAZOLE 50 MG/ML ORAL SUSPENSION: 500.0000 mg | Freq: Two times a day (BID) | ORAL | 0 refills | Status: AC

## 2019-09-20 MED ORDER — CEFTRIAXONE SODIUM 1 G IJ SOLR
500.0000 mg | Freq: Once | INTRAMUSCULAR | Status: AC
  Administered 2019-09-20: 500 mg via INTRAMUSCULAR
  Filled 2019-09-20: qty 10

## 2019-09-20 MED ORDER — STERILE WATER FOR INJECTION IJ SOLN
INTRAMUSCULAR | Status: AC
  Administered 2019-09-20: 10 mL
  Filled 2019-09-20: qty 10

## 2019-09-20 NOTE — ED Notes (Signed)
Provider at bedside

## 2019-09-20 NOTE — Discharge Instructions (Signed)
Take both antibiotics as prescribed.  Complete the entire course of antibiotics regardless of symptom improvement prevent worsening or recurrence of your infection. We will contact you with the results of your lab work when it is available. Follow-up with your GYN provider and your primary care provider. Return to the ER if you start to experience worsening abdominal pain, vomiting, chest pain or shortness of breath.

## 2019-09-20 NOTE — ED Provider Notes (Signed)
[facility] DEPT Provider Note   CSN: [id number] Arrival date & time: 09/20/19  [id number]     History Chief Complaint  Patient presents with  . Vaginal Discharge    Sheri Parker is a 24 y.o. female presents to ED for 1 week history of vaginal discharge and irritation as well as chills.  She is concerned that she may have BV, she reports history of the same.  She would also like to be tested and treated for STDs.  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  She denies any changes to abdominal pain, fevers, abnormal vaginal bleeding, possibility of pregnancy, dysuria or hematuria.  HPI     Past Medical History:  Diagnosis Date  . Medical history non-contributory   . Pollen allergies     Patient Active Problem List   Diagnosis Date Noted  . 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 Dizzy spells 01/04/2015  . Moderate headache 01/04/2015  . Left ovarian cyst 07/02/2014    Past Surgical History:  Procedure Laterality Date  . EYE MUSCLE SURGERY     x 2 one left and one right - lazy eye  . EYE SURGERY    . LAPAROSCOPIC UNILATERAL SALPINGO OOPHERECTOMY Left 07/02/2014   Procedure: LAPAROSCOPIC LEFT OOPHERECTOMY;  Surgeon: Mora Bellman, MD;  Location: [facility];  Service: Gynecology;  Laterality: Left;  . OVARIAN CYST REMOVAL  12/2015     OB History    Gravida  0   Para  0   Term  0   Preterm  0   AB  0   Living  0     SAB  0   TAB  0   Ectopic  0   Multiple  0   Live Births              Family History  Problem Relation Age of Onset  . Diabetes Mother   . Diabetes Father   . Cancer Maternal Grandmother     Social History   Tobacco Use  . Smoking status: Former Smoker    Types: E-cigarettes  . Smokeless tobacco: Never Used  Substance Use Topics  . Alcohol use: No  . Drug use: No    Home Medications Prior to Admission medications   Medication Sig Start Date  End Date Taking? Authorizing Provider  ibuprofen (ADVIL) 200 MG tablet Take 200 mg by mouth every 6 (six) hours as needed for headache or moderate pain.   Yes [provider]  doxycycline (VIBRAMYCIN) 50 MG/5ML SYRP Take 10 mLs (100 mg total) by mouth 2 (two) times daily for 7 days. 09/20/19 09/27/19  Tenley Winward, PA-C  metroNIDAZOLE (FLAGYL) 50 mg/ml oral suspension Take 10 mLs (500 mg total) by mouth 2 (two) times daily for 7 days. 09/20/19 09/27/19  Delia Heady, PA-C    Allergies    Other  Review of Systems   Review of Systems  Constitutional: Negative for appetite change, chills and fever.  HENT: Negative for ear pain, rhinorrhea, sneezing and sore throat.   Eyes: Negative for photophobia and visual disturbance.  Respiratory: Negative for cough, chest tightness, shortness of breath and wheezing.   Cardiovascular: Negative for chest pain and palpitations.  Gastrointestinal: Negative for abdominal pain, blood in stool, constipation, diarrhea, nausea and vomiting.  Genitourinary: Positive for vaginal discharge. Negative for dysuria, hematuria and urgency.  Musculoskeletal: Negative for myalgias.  Skin: Negative for rash.  Neurological: Negative for  dizziness, weakness and light-headedness.    Physical Exam Updated Vital Signs BP 124/74 (BP Location: Right Arm)   Pulse 89   Temp 98 F (36.7 C) (Oral)   Resp 19   SpO2 100%   Physical Exam Vitals and nursing note reviewed. Exam conducted with a chaperone present.  Constitutional:      General: She is not in acute distress.    Appearance: She is well-developed.  HENT:     Head: Normocephalic and atraumatic.     Nose: Nose normal.  Eyes:     General: No scleral icterus.       Left eye: No discharge.     Conjunctiva/sclera: Conjunctivae normal.  Cardiovascular:     Rate and Rhythm: Normal rate and regular rhythm.     Heart sounds: Normal heart sounds. No murmur. No friction rub. No gallop.   Pulmonary:     Effort:  Pulmonary effort is normal. No respiratory distress.     Breath sounds: Normal breath sounds.  Abdominal:     General: Bowel sounds are normal. There is no distension.     Palpations: Abdomen is soft.     Tenderness: There is no abdominal tenderness. There is no guarding.  Genitourinary:    Vagina: Vaginal discharge present.     Cervix: No cervical motion tenderness.     Uterus: Not enlarged and not tender.      Adnexa:        Right: No tenderness.         Left: Tenderness present.      Comments: Pelvic exam: normal external genitalia without evidence of trauma. VULVA: normal appearing vulva with no masses, tenderness or lesion. VAGINA: normal appearing vagina with normal color and thick white discharge, no lesions. CERVIX: normal appearing cervix without lesions, cervical motion tenderness absent, cervical os closed with out purulent discharge; No vaginal discharge. Wet prep and DNA probe for chlamydia and GC obtained.   ADNEXA: normal adnexa in size, no masses; reports chronic L adnexal tenderness UTERUS: uterus is normal size, shape, consistency and nontender.   Musculoskeletal:        General: Normal range of motion.     Cervical back: Normal range of motion and neck supple.  Skin:    General: Skin is warm and dry.     Findings: No rash.  Neurological:     Mental Status: She is alert.     Motor: No abnormal muscle tone.     Coordination: Coordination normal.     ED Results / Procedures / Treatments   Labs (all labs ordered are listed, but only abnormal results are displayed) Labs Reviewed  WET PREP, GENITAL - Abnormal; Notable for the following components:      Result Value   Clue Cells Wet Prep HPF POC PRESENT (*)    WBC, Wet Prep HPF POC MODERATE (*)    All other components within normal limits  POC URINE PREG, ED  GC/CHLAMYDIA PROBE AMP ([place]) NOT AT [facility]    EKG None  Radiology No results found.  Procedures Procedures (including critical care time)   Medications Ordered in ED Medications  cefTRIAXone (ROCEPHIN) injection 500 mg (500 mg Intramuscular Given 09/20/19 0841)  sterile water (preservative free) injection (10 mLs  Given 09/20/19 0841)    ED Course  I have reviewed the triage vital signs and the nursing notes.  Pertinent labs & imaging results that were available during my care of the patient were reviewed by me  and considered in my medical decision making (see chart for details).    MDM Rules/Calculators/A&P                      24 year old female presents to ED for 1 week history of vaginal discharge and irritation.  She has a history of BV and states that this feels similar.  She would like to be tested and treated for STDs as well.  Pelvic exam revealed vaginal discharge without cervical motion tenderness or uterine tenderness.  She does have chronic left-sided pelvic pain secondary to him oophorectomy.  She states that this does not feel different.  Wet prep revealed clue cells.  Urine pregnancy test is negative.  GC chlamydia probe pending.  Patient is requesting treatment prior to results.  Will discharge home with liquid Flagyl and doxycycline to cover for BV and STD, as requested by patient.  We will have her follow-up with her GYN provider and return for worsening symptoms.  Patient is hemodynamically stable, in NAD, and able to ambulate in the ED. Evaluation does not show pathology that would require ongoing emergent intervention or inpatient treatment. I have personally reviewed and interpreted all lab work and imaging at today's ED visit. I explained the diagnosis to the patient. Pain has been managed and has no complaints prior to discharge. Patient is comfortable with above plan and is stable for discharge at this time. All questions were answered prior to disposition. Strict return precautions for returning to the ED were discussed. Encouraged follow up with PCP.   An After Visit Summary was printed and given to the  patient.   Portions of this note were generated with L[employer]. Dictation errors may occur despite best attempts at proofreading.  Final Clinical Impression(s) / ED Diagnoses Final diagnoses:  BV (bacterial vaginosis)  Concern about STD in female without diagnosis    Rx / DC Orders ED Discharge Orders         Ordered    metroNIDAZOLE (FLAGYL) 50 mg/ml oral suspension  2 times daily     09/20/19 0851    doxycycline (VIBRAMYCIN) 50 MG/5ML SYRP  2 times daily     09/20/19 0851           Delia Heady, PA-C 09/20/19 0911    Blanchie Dessert, MD 09/23/19 [phone]

## 2019-09-20 NOTE — ED Notes (Signed)
Patient aware urine sample is needed. Awaiting patient to provide sample.

## 2019-09-20 NOTE — ED Triage Notes (Signed)
Patient here from home with complaints of vaginal burning x1 week. Reports hx of BV.

## 2019-09-20 NOTE — ED Notes (Addendum)
Patient defers pregnancy test at this time. Reports she is not pregnant as she recently had a negative pregnancy test and received her Depo-provera shot

## 2019-09-21 LAB — GC/CHLAMYDIA PROBE AMP (~~LOC~~) NOT AT ARMC
Chlamydia: NEGATIVE
Comment: NEGATIVE
Comment: NORMAL
Neisseria Gonorrhea: NEGATIVE

## 2019-09-22 ENCOUNTER — Telehealth (HOSPITAL_COMMUNITY): Payer: Self-pay

## 2019-11-20 ENCOUNTER — Ambulatory Visit (INDEPENDENT_AMBULATORY_CARE_PROVIDER_SITE_OTHER): Payer: Self-pay | Admitting: *Deleted

## 2019-11-20 ENCOUNTER — Other Ambulatory Visit: Payer: Self-pay

## 2019-11-20 DIAGNOSIS — Z3042 Encounter for surveillance of injectable contraceptive: Secondary | ICD-10-CM

## 2019-11-20 NOTE — Progress Notes (Signed)
Patient her for depo-provera . Per chart review last depoprovera over 1 year. Per medicaid must be seen every 12 months to remain on depo-provera and to cover cost. Registrar changed appointment to annual with depoprovera.  Patient asked for clarification- explained to patient per medicaid must be seen every 12 months and she will get depo shot with annual exam on 11/27/19. She voices understanding. Ruben Pyka,RN

## 2019-11-20 NOTE — Progress Notes (Signed)
Chart reviewed - agree with CMA/RN documentation.  ° °

## 2019-11-27 ENCOUNTER — Ambulatory Visit (INDEPENDENT_AMBULATORY_CARE_PROVIDER_SITE_OTHER): Payer: Medicaid Other | Admitting: Obstetrics and Gynecology

## 2019-11-27 ENCOUNTER — Other Ambulatory Visit (HOSPITAL_COMMUNITY)
Admission: RE | Admit: 2019-11-27 | Discharge: 2019-11-27 | Disposition: A | Discharge status: Home / Self Care | Payer: Medicaid Other | Source: Ambulatory Visit | Attending: Obstetrics and Gynecology | Admitting: Obstetrics and Gynecology

## 2019-11-27 ENCOUNTER — Other Ambulatory Visit: Payer: Self-pay

## 2019-11-27 ENCOUNTER — Encounter: Payer: Self-pay | Admitting: Obstetrics and Gynecology

## 2019-11-27 ENCOUNTER — Encounter: Payer: Self-pay | Admitting: Family Medicine

## 2019-11-27 VITALS — BP 110/73 | HR 77 | Ht 67.0 in | Wt 119.4 lb

## 2019-11-27 DIAGNOSIS — Z01419 Encounter for gynecological examination (general) (routine) without abnormal findings: Secondary | ICD-10-CM | POA: Diagnosis present

## 2019-11-27 DIAGNOSIS — Z113 Encounter for screening for infections with a predominantly sexual mode of transmission: Secondary | ICD-10-CM | POA: Insufficient documentation

## 2019-11-27 NOTE — Progress Notes (Signed)
GYNECOLOGY ANNUAL PREVENTATIVE CARE ENCOUNTER NOTE  Subjective:   Sheri Parker is a 24 y.o. G0P0000 female here for a routine annual gynecologic exam.  Current complaints: none.   Denies abnormal vaginal bleeding, discharge, pelvic pain, problems with intercourse or other gynecologic concerns.    Gynecologic History No LMP recorded. Patient has had an injection. Patient is currently sexually active  Contraception: Depo-Provera injections Last Pap: never.  Obstetric History OB History  Gravida Para Term Preterm AB Living  0 0 0 0 0 0  SAB TAB Ectopic Multiple Live Births  0 0 0 0      Past Medical History:  Diagnosis Date  . Medical history non-contributory   . Pollen allergies     Past Surgical History:  Procedure Laterality Date  . EYE MUSCLE SURGERY     x 2 one left and one right - lazy eye  . EYE SURGERY    . LAPAROSCOPIC UNILATERAL SALPINGO OOPHERECTOMY Left 07/02/2014   Procedure: LAPAROSCOPIC LEFT OOPHERECTOMY;  Surgeon: Mora Bellman, MD;  Location: [facility];  Service: Gynecology;  Laterality: Left;  . OVARIAN CYST REMOVAL  12/2015    Current Outpatient Medications on File Prior to Visit  Medication Sig Dispense Refill  . ibuprofen (ADVIL) 200 MG tablet Take 200 mg by mouth every 6 (six) hours as needed for headache or moderate pain.     Current Facility-Administered Medications on File Prior to Visit  Medication Dose Route Frequency Provider Last Rate Last Admin  . medroxyPROGESTERone (DEPO-PROVERA) injection 150 mg  150 mg Intramuscular Q90 days Starr Lake, CNM   150 mg at 06/18/19 0920  . medroxyPROGESTERone (DEPO-PROVERA) injection 150 mg  150 mg Intramuscular Once Chancy Milroy, MD        Allergies  Allergen Reactions  . Other     Seasonal Allergies    Social History   Socioeconomic History  . Marital status: Single    Spouse name: Not on file  . Number of children: Not on file  . Years of education: Not on file  . Highest  education level: Not on file  Occupational History  . Not on file  Tobacco Use  . Smoking status: Former Smoker    Types: E-cigarettes  . Smokeless tobacco: Never Used  Vaping Use  . Vaping Use: Former  Substance and Sexual Activity  . Alcohol use: No  . Drug use: No  . Sexual activity: Yes    Birth control/protection: None, Injection  Other Topics Concern  . Not on file  Social History Narrative  . Not on file   Social Determinants of Health   Financial Resource Strain:   . Difficulty of Paying Living Expenses:   Food Insecurity: No Food Insecurity  . Worried About Charity fundraiser in the Last Year: Never true  . Ran Out of Food in the Last Year: Never true  Transportation Needs: No Transportation Needs  . Lack of Transportation (Medical): No  . Lack of Transportation (Non-Medical): No  Physical Activity:   . Days of Exercise per Week:   . Minutes of Exercise per Session:   Stress:   . Feeling of Stress :   Social Connections:   . Frequency of Communication with Friends and Family:   . Frequency of Social Gatherings with Friends and Family:   . Attends Religious Services:   . Active Member of Clubs or Organizations:   . Attends Archivist Meetings:   Marland Kitchen Marital Status:  Intimate Partner Violence:   . Fear of Current or Ex-Partner:   . Emotionally Abused:   Marland Kitchen Physically Abused:   . Sexually Abused:     Family History  Problem Relation Age of Onset  . Diabetes Mother   . Diabetes Father   . Cancer Maternal Grandmother     The following portions of the patient's history were reviewed and updated as appropriate: allergies, current medications, past family history, past medical history, past social history, past surgical history and problem list.  Review of Systems A comprehensive review of systems was negative.   Objective:  BP 110/73   Pulse 77   Ht 5\' 7"  (1.702 m)   Wt 54.2 kg   BMI 18.70 kg/m  Wt Readings from Last 3 Encounters:    11/27/19 54.2 kg  09/04/19 56.3 kg  06/18/19 54.7 kg     Chaperone present during exam  CONSTITUTIONAL: Well-developed, well-nourished female in no acute distress.  HENT:  Normocephalic, atraumatic, External right and left ear normal. Oropharynx is clear and moist EYES: Conjunctivae and EOM are normal. Pupils are equal, round, and reactive to light. No scleral icterus.  NECK: Normal range of motion, supple, no masses.  Normal thyroid.   CARDIOVASCULAR: Normal heart rate noted, regular rhythm RESPIRATORY: Clear to auscultation bilaterally. Effort and breath sounds normal, no problems with respiration noted. BREASTS: Symmetric in size. No masses, skin changes, nipple drainage, or lymphadenopathy. ABDOMEN: Soft, normal bowel sounds, no distention noted.  No tenderness, rebound or guarding.  PELVIC: Normal appearing external genitalia; normal appearing vaginal mucosa and cervix.  Mild, white, blood tinged discharge noted.  Normal uterine size, no other palpable masses, no uterine or adnexal tenderness. MUSCULOSKELETAL: Normal range of motion. No tenderness.  No cyanosis, clubbing, or edema.  2+ distal pulses. SKIN: Skin is warm and dry. No rash noted. Not diaphoretic. No erythema. No pallor. NEUROLOGIC: Alert and oriented to person, place, and time. Normal reflexes, muscle tone coordination. No cranial nerve deficit noted. PSYCHIATRIC: Normal mood and affect. Normal behavior. Normal judgment and thought content.  Assessment:  Annual gynecologic examination with pap smear   Plan:  1. Well Woman Exam Will follow up results of pap smear and manage accordingly. Mammogram scheduled STD testing discussed. Patient requested testing Discussed exercise and diet Calcium and Vitamin D supplementation discussed  1. Encounter for annual routine gynecological examination - Depo given today. - Cytology - PAP( [place])  2. Screen for STD (sexually transmitted disease)  - Cervicovaginal  ancillary only( [place]) - HIV Antibody (routine testing w rflx) - RPR - Hepatitis B Surface AntiGEN - Hepatitis C Antibody -  Wet prep ordered.  Routine preventative health maintenance measures emphasized. Please refer to After Visit Summary for other counseling recommendations.    Loma Boston, [place] for [employer]

## 2019-11-28 LAB — HEPATITIS C ANTIBODY: Hep C Virus Ab: <0.1 s/co ratio (ref 0.0–0.9)

## 2019-11-28 LAB — HEPATITIS B SURFACE ANTIGEN: Hepatitis B Surface Ag: NEGATIVE

## 2019-11-28 LAB — RPR: RPR Ser Ql: NONREACTIVE

## 2019-11-28 LAB — HIV ANTIBODY (ROUTINE TESTING W REFLEX): HIV Screen 4th Generation wRfx: NONREACTIVE

## 2019-11-30 LAB — CERVICOVAGINAL ANCILLARY ONLY
Bacterial Vaginitis (gardnerella): NEGATIVE
Candida Glabrata: NEGATIVE
Candida Vaginitis: NEGATIVE
Chlamydia: NEGATIVE
Comment: NEGATIVE
Comment: NEGATIVE
Comment: NEGATIVE
Comment: NEGATIVE
Comment: NEGATIVE
Comment: NORMAL
Neisseria Gonorrhea: NEGATIVE
Trichomonas: NEGATIVE

## 2019-11-30 LAB — CYTOLOGY - PAP
Adequacy: ABSENT
Diagnosis: NEGATIVE

## 2020-01-04 ENCOUNTER — Encounter: Payer: Self-pay | Admitting: Obstetrics and Gynecology

## 2020-01-04 ENCOUNTER — Other Ambulatory Visit (HOSPITAL_COMMUNITY)
Admission: RE | Admit: 2020-01-04 | Discharge: 2020-01-04 | Disposition: A | Discharge status: Home / Self Care | Payer: Medicaid Other | Source: Ambulatory Visit | Attending: Obstetrics and Gynecology | Admitting: Obstetrics and Gynecology

## 2020-01-04 ENCOUNTER — Ambulatory Visit (INDEPENDENT_AMBULATORY_CARE_PROVIDER_SITE_OTHER): Payer: Self-pay | Admitting: Obstetrics and Gynecology

## 2020-01-04 ENCOUNTER — Other Ambulatory Visit: Payer: Self-pay

## 2020-01-04 VITALS — BP 114/82 | HR 97 | Ht 67.0 in | Wt 120.5 lb

## 2020-01-04 DIAGNOSIS — N762 Acute vulvitis: Secondary | ICD-10-CM | POA: Insufficient documentation

## 2020-01-04 DIAGNOSIS — N9089 Other specified noninflammatory disorders of vulva and perineum: Secondary | ICD-10-CM

## 2020-01-04 MED ORDER — VALACYCLOVIR HCL 1 G PO TABS: 1000.0000 mg | ORAL_TABLET | Freq: Every day | ORAL | 0 refills | Status: AC

## 2020-01-04 MED ORDER — BENZOCAINE-MENTHOL 20-0.5 % EX AERO: 1.0000 "application " | INHALATION_SPRAY | Freq: Four times a day (QID) | CUTANEOUS | 1 refills | Status: DC | PRN

## 2020-01-04 MED ORDER — LIDOCAINE HCL URETHRAL/MUCOSAL 2 % EX GEL: 1.0000 "application " | CUTANEOUS | 2 refills | Status: DC | PRN

## 2020-01-04 MED ORDER — DOXYCYCLINE HYCLATE 100 MG PO CAPS: 100.0000 mg | ORAL_CAPSULE | Freq: Two times a day (BID) | ORAL | 0 refills | Status: DC

## 2020-01-04 NOTE — Progress Notes (Addendum)
Obstetrics and Gynecology Visit Return Patient Evaluation  Appointment Date: 01/04/2020  Primary Care Provider: Patient, No Pcp Per  OBGYN Clinic: [facility]  Chief Complaint: vulvar pain and irritation  History of Present Illness:  Sheri Parker is a 24 y.o. with above s/s. Patient shaved about a 1.5-2 weeks ago and noticed small cuts that were irritated. She has a h/o this in the past with shaving it resolved with vagisil and cortisone cream but this time it worsened with increased pain and discomfort. No h/o HSV  Review of Systems: as noted in the History of Present Illness.  Medications:  Ginette Otto had no medications administered during this visit. Current Outpatient Medications  Medication Sig Dispense Refill  . ibuprofen (ADVIL) 200 MG tablet Take 200 mg by mouth every 6 (six) hours as needed for headache or moderate pain.     Current Facility-Administered Medications  Medication Dose Route Frequency Provider Last Rate Last Admin  . medroxyPROGESTERone (DEPO-PROVERA) injection 150 mg  150 mg Intramuscular Q90 days Starr Lake, CNM   150 mg at 06/18/19 0920  . medroxyPROGESTERone (DEPO-PROVERA) injection 150 mg  150 mg Intramuscular Once Chancy Milroy, MD        Allergies: is allergic to other.  Physical Exam:  BP 114/82   Pulse 97   Ht 5\' 7"  (1.702 m)   Wt 120 lb 8 oz (54.7 kg)   BMI 18.87 kg/m  Body mass index is 18.87 kg/m. General appearance: Well nourished, well developed female in no acute distress.  Abdomen: diffusely non tender to palpation, non distended, and no masses, hernias Neuro/Psych:  Normal mood and affect.    Pelvic exam:  EGBUS: from the posterior fourchette near to the rectum there is ulcerated lesion with healthy pink granulation tissue with some white-yellowish discharge that is moderately ttp. 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 Vulvar irritation Sitz baths several times a day. Topical lidocaine F/u swabs Doxy bid and valtrex prescribed D/w her if s/s worsen to call office or go to ED  Pt wants to hold off on valtrex b/c she doesn't believe she has herpes and wants to wait until swab is back. I told her that swab has a decent false negative especially if there is a bacterial infection, too. I told her risk of worsening infection and she understands this.  - Herpes simplex virus culture - Cervicovaginal ancillary only( [place])  2. Acute vulvitis - Herpes simplex virus culture - Cervicovaginal ancillary only( [place])   RTC: 1wk  Durene Romans MD Attending Center for [facility])

## 2020-01-05 ENCOUNTER — Telehealth (INDEPENDENT_AMBULATORY_CARE_PROVIDER_SITE_OTHER): Payer: Self-pay | Admitting: Obstetrics and Gynecology

## 2020-01-05 ENCOUNTER — Other Ambulatory Visit: Payer: Self-pay | Admitting: Obstetrics and Gynecology

## 2020-01-05 ENCOUNTER — Telehealth: Payer: Self-pay | Admitting: Obstetrics and Gynecology

## 2020-01-05 DIAGNOSIS — N762 Acute vulvitis: Secondary | ICD-10-CM

## 2020-01-05 DIAGNOSIS — N9089 Other specified noninflammatory disorders of vulva and perineum: Secondary | ICD-10-CM

## 2020-01-05 MED ORDER — ACYCLOVIR 200 MG/5ML PO SUSP: 400.0000 mg | Freq: Three times a day (TID) | ORAL | 0 refills | Status: DC

## 2020-01-05 MED ORDER — ACYCLOVIR 200 MG/5ML PO SUSP: 400.0000 mg | Freq: Three times a day (TID) | ORAL | 0 refills | Status: AC

## 2020-01-05 MED ORDER — DOXYCYCLINE CALCIUM 50 MG/5ML PO SYRP: 100.0000 mg | ORAL_SOLUTION | Freq: Two times a day (BID) | ORAL | 0 refills | Status: AC

## 2020-01-05 NOTE — Telephone Encounter (Signed)
Patient called into the office wanting to speak to a nurse about eh dermablast spray that she was prescribed yesterday. She wanted to know if the cuts had to be covered up or does she just spray it on them. Patient instructed that a message will be sent to the nurses and they will contact her as soon as they can. Patient verbalized understanding and message sent to the clinical pool.

## 2020-01-05 NOTE — Addendum Note (Signed)
Addended by: Annabell Howells on: 01/05/2020 05:01 PM   Modules accepted: Orders

## 2020-01-05 NOTE — Telephone Encounter (Signed)
Pt called front office to follow up regarding medication. I spoke with pt and explained we are waiting to hear back from [place], MD. Pt requests a note to return to work.   [place], MD called and requested I explain to pt that these antibiotics are the only options. Ilda Basset, MD states pt may return to work at any time.   Called pt. Letter sent via MyChart and pt confirms receipt. Medications looked up on [facility] with patient on the phone. Pt feels she can afford the Acyclovir oral solution with [facility] coupon; reordered this medication to [facility] so that pt may use coupon. Pt has no further concerns at this time.

## 2020-01-05 NOTE — Telephone Encounter (Signed)
please call her and let her know that I sent in liquid pills. As I told her yesterday, w/o insurance it will be expensive but if she insists that she can't take pills then that is her only option

## 2020-01-05 NOTE — Telephone Encounter (Signed)
error 

## 2020-01-05 NOTE — Telephone Encounter (Signed)
Called patient stating I was returning her phone call. Patient states she figured out how to do the medicine now but wasn't sure if she could directly spray it on the cuts. She reports some burning with it. Told patient sometimes numbing medicines can burn a little upon applying but it should stop burning fairly quickly. Told patient it could be sprayed directly or she can spray it on a pad or cotton ball then apply to the sores. Patient verbalized understanding & asked about doxycycline prescription. Patient states she was told she could break it open by one pharmacist then was told by another she couldn't. She is waiting for the liquid option. Told patient the liquid form isn't covered by her insurance and will cost approximately $106 (per [facility]). Patient verbalized understanding and states she cannot afford that and needs an alternative option. Patient states she doesn't even know what the prescription is for. Told patient per Dr Ilda Basset note when he looked at those sores yesterday he was concerned about infection which is why he sent in the antibiotics. Told patient I would reach out to Dr Ilda Basset about her needing something different. Patient states she already told him she couldn't take pills, now it is going to be days before something else gets sent in and in the meantime the infection will spread. Told patient it would not take days and Dr Ilda Basset is very quick to respond to messages. Patient states she has been having pain as well and needs something for that. Told patient Dr Ilda Basset prescribed the lidocaine gel for that. Told her pharmacy informed me it is not currently in stock but they will hopefully have it later today. Recommended she follow up with them regarding availability. Told patient that would be the best thing for her pain in addition to the spray. Patient verbalized understanding & will await new prescription information from Dr Ilda Basset.

## 2020-01-06 ENCOUNTER — Telehealth: Payer: Self-pay | Admitting: Obstetrics and Gynecology

## 2020-01-06 LAB — CERVICOVAGINAL ANCILLARY ONLY
Bacterial Vaginitis (gardnerella): POSITIVE — AB
Candida Glabrata: NEGATIVE
Candida Vaginitis: POSITIVE — AB
Chlamydia: NEGATIVE
Comment: NEGATIVE
Comment: NEGATIVE
Comment: NEGATIVE
Comment: NEGATIVE
Comment: NEGATIVE
Comment: NORMAL
Neisseria Gonorrhea: NEGATIVE
Trichomonas: NEGATIVE

## 2020-01-06 LAB — HERPES SIMPLEX VIRUS CULTURE

## 2020-01-06 NOTE — Telephone Encounter (Signed)
Patient called into the office wanting to know if she has test results ready. She stated that she is unable to get into her mychart at this time. Patient instructed that a message will be sent to the nurses the and they will contact her as soon as possible. Patient verbalized understanding. Patient was given her mychart username and password was changed.

## 2020-01-07 ENCOUNTER — Other Ambulatory Visit (INDEPENDENT_AMBULATORY_CARE_PROVIDER_SITE_OTHER): Payer: Self-pay

## 2020-01-07 ENCOUNTER — Telehealth: Payer: Self-pay | Admitting: Obstetrics and Gynecology

## 2020-01-07 ENCOUNTER — Encounter: Payer: Self-pay | Admitting: Obstetrics and Gynecology

## 2020-01-07 DIAGNOSIS — B009 Herpesviral infection, unspecified: Secondary | ICD-10-CM

## 2020-01-07 HISTORY — DX: Herpesviral infection, unspecified: B00.9

## 2020-01-07 MED ORDER — SOLOSEC 2 G PO PACK: 1.0000 | PACK | Freq: Once | ORAL | 0 refills | Status: AC

## 2020-01-07 MED ORDER — MICONAZOLE NITRATE 2 % VA CREA: 1.0000 | TOPICAL_CREAM | Freq: Every day | VAGINAL | 0 refills | Status: AC

## 2020-01-07 MED ORDER — METRONIDAZOLE 500 MG PO TABS: 500.0000 mg | ORAL_TABLET | Freq: Two times a day (BID) | ORAL | 0 refills | Status: DC

## 2020-01-07 NOTE — Telephone Encounter (Signed)
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 I called Ms. Rouillard, and she states that she doesn't believe the swab is correct and she has an appointment on 29th for re-testing. I told her that I strongly advise her to take the abx and the acyclovir and she states that she is taking them, and I told her that if she wants re-testing on the 29th then that's fine. There is a chance the repeat swab could be negative due to patient actively getting treatment.  All questions asked and answered  Durene Romans MD Attending Center for [employer] (Faculty Practice) 01/07/2020 Time: [phone]

## 2020-01-07 NOTE — Addendum Note (Signed)
Addended by: Aletha Halim on: 01/07/2020 09:04 AM   Modules accepted: Orders

## 2020-01-07 NOTE — Telephone Encounter (Signed)
Pt call transferred from the front office.  Pt informs me that she is upset and frustrated that she has been getting told different things and she wants to know why the provider has not called her.  I apologized to the pt that she feels that she has been getting conflicting information however we do want to help her in anyway that we can.  I also informed the pt that looking in her chart we have changed her Rx's to liquid form instead of the tablets.  Pt states yes because I do not like taking pills.  Pt states that she has the acycolvir however has not started taking it because she was unclear of what it was for.  I informed the pt that the acycolvir is help treat the suspicius lesion that could be herpes.  Pt becomes upset and says "I don't know why yall had me pick up that medication, I do not have herpes.  Arnetha Gula are making me buy unnecessary prescriptions and this does not even treat the bacterial infection."  I explained to the patient that is why she is prescribed the doxycycline syrup so that it will tx the bacterial infection.  Pt reports that she has the pills and not the syrup.  Pt also states that the dermoplast is not working that she purchased.  Pt states that the pain is increasing wants something to be effective.  I advised the pt to take the acyclovir as Dr. Ilda Basset has prescribed prophylaxis to treat the suspicion of herpes until we get the result back in hopes that it will give her some relief.  Pt states that she knows that she does not have herpes and does not understand why she was prescribed that.  I explained to the pt that if she decided to not take the medication that is her choice however the area of concern may become worse.  Pt states I still am not getting treated for my bacterial infection.  Pt states that she took a white pill in the past.  I informed pt that BV can be treated with Flagyl that is a white tablet and it can be crushed is she okay with that. I verified with pt's pharmacy  that Flagyl can be crushed.  I advised pt to take the Flagyl medication to treat the BV since she has taken that in the past.  I asked pt if I could place her on hold so that I f.u with our lab tech to see if her results for her HSV are back.  Lab Tech was able to obtain HSV results which resulted positive.  I informed pt positive results of HSV and encouraged the need to take the acyclovir.  I explained to the what HSV is and also sent pt information via MyChart.  Pt requested to get retested.  I informed pt that it was her choice however I would not recommend that she goes any longer without taking the acyclovir and if she starts to take it that in order to be restest there must be an active lesion and it may be gone.  I advised pt to write down questions so that she can address with Dr. Ilda Basset at her appt scheduled on 01/14/20.  Pt verbalized understanding.    Mel Almond, RN

## 2020-01-14 ENCOUNTER — Ambulatory Visit: Payer: Medicaid Other | Admitting: Obstetrics and Gynecology

## 2020-01-18 ENCOUNTER — Other Ambulatory Visit: Payer: Self-pay

## 2020-01-18 ENCOUNTER — Ambulatory Visit (INDEPENDENT_AMBULATORY_CARE_PROVIDER_SITE_OTHER): Payer: Self-pay | Admitting: Obstetrics and Gynecology

## 2020-01-18 ENCOUNTER — Encounter: Payer: Self-pay | Admitting: Obstetrics and Gynecology

## 2020-01-18 VITALS — BP 126/81 | HR 66 | Ht 67.0 in | Wt 119.0 lb

## 2020-01-18 DIAGNOSIS — B009 Herpesviral infection, unspecified: Secondary | ICD-10-CM

## 2020-01-18 DIAGNOSIS — Z760 Encounter for issue of repeat prescription: Secondary | ICD-10-CM

## 2020-01-18 MED ORDER — ACYCLOVIR 200 MG/5ML PO SUSP: 800.0000 mg | Freq: Two times a day (BID) | ORAL | 1 refills | Status: AC

## 2020-01-18 NOTE — Progress Notes (Signed)
Obstetrics and Gynecology Visit Return Patient Evaluation  Appointment Date: 01/18/2020  Primary Care Provider: Patient, No Pcp Per  OBGYN Clinic: [facility]  Chief Complaint: f/u exam for HSV outbreak   History of Present Illness:  Sheri Parker is a 24 y.o. initially seen by me on 7/19 for vulvar lesion, rash, pain. Presumptive dx of HSV outbreak and possible bacterial infection superimposed made. Pt prescribed valtrex, doxy and swab sent. Pt can't take pills and insurance issues with coverage of meds but eventually regimen of acyclovir susp and flagyl pills, which patient completed. Her swab did come back + for HSV. Recent STD blood testing was negative  Interval History: Since that time, she states that she is feeling much better  Review of Systems: as noted in the History of Present Illness.  Medications: None Allergies: is allergic to latex and other.  Physical Exam:  BP 126/81   Pulse 66   Ht 5\' 7"  (1.702 m)   Wt 119 lb (54 kg)   BMI 18.64 kg/m  Body mass index is 18.64 kg/m. General appearance: Well nourished, well developed female in no acute distress.   Neuro/Psych:  Normal mood and affect.    Pelvic exam:  EGBUS is very much improved with flat skin areas nearly healed and some skin cracks at folds in the skin that are healing well   Assessment: pt doing well  Plan: Routine care. Barrier methods and d/w pt re: prodromal s/s. Refill for acyclovir given to patient to keep on hand for any future outbreaks  RTC: prn  Durene Romans MD Attending Center for [facility])

## 2020-01-25 ENCOUNTER — Telehealth (INDEPENDENT_AMBULATORY_CARE_PROVIDER_SITE_OTHER): Payer: Self-pay | Admitting: Obstetrics and Gynecology

## 2020-01-25 DIAGNOSIS — B009 Herpesviral infection, unspecified: Secondary | ICD-10-CM

## 2020-01-25 NOTE — Telephone Encounter (Signed)
Called patient in regards to her questions in regards to a follow up from her last appointment.   She has questions about the medications and reoccurance of HSV that started today. She is planning to go and pick up the Valtrex on Wednesday once she is paid.   She is spotting and is close to her dosage. Last Depo appt on 6/4. She wants to know if the spotting is related to the HSV and vice versa. She would like to know what may have caused her outbreak.   Routed to Dr. Ilda Basset.

## 2020-01-25 NOTE — Telephone Encounter (Signed)
Patient called requesting to speak to Dr. Ilda Basset. She has questions.

## 2020-01-31 NOTE — Telephone Encounter (Signed)
Spotting is likely related to normal spotting that can happened with depo provera.

## 2020-02-01 NOTE — Telephone Encounter (Signed)
Informed pt the advisement on why she could be having spotting.  Pt states that she already knew that however she wanted to know if being on her period could cause her to have an outbreak.  I informed pt that anything that can cause stress on the body can cause her to have an outbreak.  I also informed pt that if she needs a refill she just needs to contact her pharmacy in regards to the refill.  Pt verbalized understanding.  Mel Almond, RN

## 2020-02-05 ENCOUNTER — Ambulatory Visit: Payer: Medicaid Other

## 2020-02-12 ENCOUNTER — Encounter: Payer: Self-pay | Admitting: *Deleted

## 2020-02-12 ENCOUNTER — Other Ambulatory Visit: Payer: Self-pay

## 2020-02-12 ENCOUNTER — Ambulatory Visit (INDEPENDENT_AMBULATORY_CARE_PROVIDER_SITE_OTHER): Payer: Medicaid Other | Admitting: *Deleted

## 2020-02-12 ENCOUNTER — Encounter: Payer: Self-pay | Admitting: Obstetrics and Gynecology

## 2020-02-12 VITALS — BP 123/77 | HR 68 | Ht 67.0 in | Wt 117.7 lb

## 2020-02-12 DIAGNOSIS — Z3042 Encounter for surveillance of injectable contraceptive: Secondary | ICD-10-CM

## 2020-02-12 MED ORDER — MEDROXYPROGESTERONE ACETATE 150 MG/ML IM SUSP
150.0000 mg | Freq: Once | INTRAMUSCULAR | Status: AC
  Administered 2020-02-12: 150 mg via INTRAMUSCULAR

## 2020-02-12 NOTE — Progress Notes (Signed)
Pt presented as scheduled for Depo Provera which was administered. Pt tolerated well. Next injection due 11/12-11/26. Pt will be due for next Annual exam after 11/26/20. She had no questions or c/o.

## 2020-02-15 NOTE — Progress Notes (Signed)
Chart reviewed for nurse visit. Agree with plan of care.   Luvenia Redden, PA-C 02/15/2020 10:16 AM

## 2020-03-14 ENCOUNTER — Other Ambulatory Visit: Payer: Self-pay | Admitting: Obstetrics and Gynecology

## 2020-03-15 ENCOUNTER — Other Ambulatory Visit: Payer: Self-pay | Admitting: Obstetrics and Gynecology

## 2020-03-15 ENCOUNTER — Other Ambulatory Visit: Payer: Self-pay | Admitting: Lactation Services

## 2020-03-15 MED ORDER — ACYCLOVIR 200 MG/5ML PO SUSP: 800.0000 mg | Freq: Two times a day (BID) | ORAL | Status: DC

## 2020-03-15 MED ORDER — ACYCLOVIR 200 MG/5ML PO SUSP: 800.0000 mg | Freq: Two times a day (BID) | ORAL | 11 refills | Status: AC

## 2020-03-15 NOTE — Progress Notes (Signed)
Acyclovir ordered at patients request and reports current outbreak

## 2020-03-15 NOTE — Progress Notes (Signed)
Medication reordered as was ordered incorrectly previously.

## 2020-03-15 NOTE — Progress Notes (Signed)
Sheri Parker per pt request.

## 2020-04-29 ENCOUNTER — Ambulatory Visit (INDEPENDENT_AMBULATORY_CARE_PROVIDER_SITE_OTHER): Payer: Medicaid Other

## 2020-04-29 ENCOUNTER — Other Ambulatory Visit: Payer: Self-pay

## 2020-04-29 VITALS — BP 116/70 | HR 80 | Wt 121.7 lb

## 2020-04-29 DIAGNOSIS — Z3042 Encounter for surveillance of injectable contraceptive: Secondary | ICD-10-CM

## 2020-04-29 MED ORDER — MEDROXYPROGESTERONE ACETATE 150 MG/ML IM SUSP
150.0000 mg | Freq: Once | INTRAMUSCULAR | Status: AC
  Administered 2020-04-29: 150 mg via INTRAMUSCULAR

## 2020-04-29 NOTE — Progress Notes (Signed)
Chart reviewed for nurse visit. Agree with plan of care.   Clarnce Flock, MD 04/29/20 11:02 AM

## 2020-04-29 NOTE — Progress Notes (Signed)
Ginette Otto here for Depo-Provera Injection. Injection administered without complication. Patient will return in 3 months. Next annual visit due 12/2020.   Verdell Carmine, RN 04/29/2020  9:27 AM

## 2020-05-15 ENCOUNTER — Other Ambulatory Visit: Payer: Self-pay

## 2020-05-15 ENCOUNTER — Emergency Department (HOSPITAL_COMMUNITY)
Admission: EM | Admit: 2020-05-15 | Discharge: 2020-05-15 | Disposition: A | Discharge status: Home / Self Care | Payer: Medicaid Other | Attending: Emergency Medicine | Admitting: Emergency Medicine

## 2020-05-15 ENCOUNTER — Encounter (HOSPITAL_COMMUNITY): Payer: Self-pay

## 2020-05-15 DIAGNOSIS — Z91040 Latex allergy status: Secondary | ICD-10-CM | POA: Insufficient documentation

## 2020-05-15 DIAGNOSIS — N760 Acute vaginitis: Secondary | ICD-10-CM | POA: Insufficient documentation

## 2020-05-15 DIAGNOSIS — N898 Other specified noninflammatory disorders of vagina: Secondary | ICD-10-CM

## 2020-05-15 DIAGNOSIS — Z87891 Personal history of nicotine dependence: Secondary | ICD-10-CM | POA: Insufficient documentation

## 2020-05-15 LAB — WET PREP, GENITAL
Sperm: NONE SEEN
Trich, Wet Prep: NONE SEEN
Yeast Wet Prep HPF POC: NONE SEEN

## 2020-05-15 LAB — URINALYSIS, ROUTINE W REFLEX MICROSCOPIC
Bacteria, UA: NONE SEEN
Bilirubin Urine: NEGATIVE
Glucose, UA: NEGATIVE mg/dL
Hgb urine dipstick: NEGATIVE
Ketones, ur: NEGATIVE mg/dL
Nitrite: NEGATIVE
Protein, ur: 30 mg/dL — AB
Specific Gravity, Urine: 1.027 (ref 1.005–1.030)
pH: 5 (ref 5.0–8.0)

## 2020-05-15 LAB — PREGNANCY, URINE: Preg Test, Ur: NEGATIVE

## 2020-05-15 MED ORDER — METRONIDAZOLE 500 MG PO TABS: 500.0000 mg | ORAL_TABLET | Freq: Two times a day (BID) | ORAL | 0 refills | Status: AC

## 2020-05-15 NOTE — ED Provider Notes (Signed)
[facility] DEPT Provider Note   CSN: [id number] Arrival date & time: 05/15/20  9371     History Chief Complaint  Patient presents with  . Vaginal Discharge    Sheri Parker is a 24 y.o. female.  The history is provided by the patient and medical records. No language interpreter was used.  Vaginal Discharge Quality:  White Severity:  Moderate Onset quality:  Gradual Duration:  4 days Timing:  Constant Progression:  Unchanged Chronicity:  Recurrent Relieved by:  Nothing Worsened by:  Nothing Ineffective treatments:  None tried Associated symptoms: vaginal itching   Associated symptoms: no abdominal pain, no dysuria, no fever, no nausea, no rash, no urinary frequency, no urinary hesitancy, no urinary incontinence and no vomiting        Past Medical History:  Diagnosis Date  . 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 Medical history non-contributory   . Pollen allergies     Patient Active Problem List   Diagnosis Date Noted  . 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 Dizzy spells 01/04/2015  . Moderate headache 01/04/2015  . Left ovarian cyst 07/02/2014    Past Surgical History:  Procedure Laterality Date  . EYE MUSCLE SURGERY     x 2 one left and one right - lazy eye  . EYE SURGERY    . LAPAROSCOPIC UNILATERAL SALPINGO OOPHERECTOMY Left 07/02/2014   Procedure: LAPAROSCOPIC LEFT OOPHERECTOMY;  Surgeon: Mora Bellman, MD;  Location: [facility];  Service: Gynecology;  Laterality: Left;  . OVARIAN CYST REMOVAL  12/2015     OB History    Gravida  0   Para  0   Term  0   Preterm  0   AB  0   Living  0     SAB  0   TAB  0   Ectopic  0   Multiple  0   Live Births              Family History  Problem Relation Age of Onset  . Diabetes Mother   . Diabetes Father   . Cancer Maternal Grandmother     Social History   Tobacco Use  . Smoking status: Former Smoker     Types: E-cigarettes  . Smokeless tobacco: Never Used  Vaping Use  . Vaping Use: Former  Substance Use Topics  . Alcohol use: No  . Drug use: No    Home Medications Prior to Admission medications   Not on File    Allergies    Latex and Other  Review of Systems   Review of Systems  Constitutional: Negative for chills, diaphoresis, fatigue and fever.  HENT: Negative for congestion.   Eyes: Negative for visual disturbance.  Respiratory: Negative for cough, chest tightness, shortness of breath and wheezing.   Cardiovascular: Negative for chest pain, palpitations and leg swelling.  Gastrointestinal: Negative for abdominal distention, abdominal pain, constipation, diarrhea, nausea and vomiting.  Genitourinary: Positive for vaginal discharge. Negative for bladder incontinence, decreased urine volume, difficulty urinating, dysuria, flank pain, frequency, hesitancy, vaginal bleeding and vaginal pain.  Musculoskeletal: Negative for back pain, neck pain and neck stiffness.  Skin: Negative for rash and wound.  Neurological: Negative for light-headedness and headaches.  Psychiatric/Behavioral: Negative for agitation and confusion.  All other systems reviewed and are negative.   Physical Exam Updated Vital Signs BP 118/79 (BP Location: Left Arm)   Pulse 66   Temp 98.2 F (36.8 C) (  Oral)   Resp 16   Ht 5' 7.5" (1.715 m)   Wt 54.4 kg   SpO2 100%   BMI 18.52 kg/m   Physical Exam Vitals and nursing note reviewed.  Constitutional:      General: She is not in acute distress.    Appearance: She is well-developed. She is not ill-appearing, toxic-appearing or diaphoretic.  HENT:     Head: Normocephalic and atraumatic.     Right Ear: External ear normal.     Left Ear: External ear normal.     Nose: Nose normal. No congestion or rhinorrhea.     Mouth/Throat:     Mouth: Mucous membranes are moist.     Pharynx: No oropharyngeal exudate or posterior oropharyngeal erythema.  Eyes:      Conjunctiva/sclera: Conjunctivae normal.     Pupils: Pupils are equal, round, and reactive to light.  Cardiovascular:     Rate and Rhythm: Normal rate.     Pulses: Normal pulses.     Heart sounds: No murmur heard.   Pulmonary:     Effort: Pulmonary effort is normal. No respiratory distress.     Breath sounds: Normal breath sounds. No stridor. No wheezing, rhonchi or rales.  Chest:     Chest wall: No tenderness.  Abdominal:     General: Abdomen is flat. There is no distension.     Tenderness: There is no abdominal tenderness. There is no right CVA tenderness, left CVA tenderness, guarding or rebound.  Genitourinary:    General: Normal vulva.     Vagina: Vaginal discharge present.  Musculoskeletal:        General: No tenderness or signs of injury.     Cervical back: Normal range of motion and neck supple. No tenderness.  Skin:    General: Skin is warm.     Capillary Refill: Capillary refill takes less than 2 seconds.     Coloration: Skin is not pale.     Findings: No erythema or rash.  Neurological:     Mental Status: She is alert. Mental status is at baseline.     Motor: No abnormal muscle tone.     Deep Tendon Reflexes: Reflexes are normal and symmetric.  Psychiatric:        Mood and Affect: Mood normal.     ED Results / Procedures / Treatments   Labs (all labs ordered are listed, but only abnormal results are displayed) Labs Reviewed  WET PREP, GENITAL - Abnormal; Notable for the following components:      Result Value   Clue Cells Wet Prep HPF POC PRESENT (*)    WBC, Wet Prep HPF POC MODERATE (*)    All other components within normal limits  URINALYSIS, ROUTINE W REFLEX MICROSCOPIC - Abnormal; Notable for the following components:   APPearance HAZY (*)    Protein, ur 30 (*)    Leukocytes,Ua TRACE (*)    All other components within normal limits  URINE CULTURE  PREGNANCY, URINE  POC URINE PREG, ED  GC/CHLAMYDIA PROBE AMP ([place]) NOT AT [facility]     EKG None  Radiology No results found.  Procedures Procedures (including critical care time)  Medications Ordered in ED Medications - No data to display  ED Course  I have reviewed the triage vital signs and the nursing notes.  Pertinent labs & imaging results that were available during my care of the patient were reviewed by me and considered in my medical decision making (see chart for details).  MDM Rules/Calculators/A&P                          Sheri Parker is a 24 y.o. female with a past medical history significant for HSV-2 infection chronically, prior ovarian cyst, and prior STI who presents with vaginal discharge.  Patient reports that for last few days, she has been having a white vaginal discharge that seems to her leg BV or yeast infection.  She reports has had these in the past.  She reports no significant pain but has some irritation in the groin.  She denies dysuria or hematuria.  She denies any fevers, chills, chest pain, shortness breath, nausea, vomiting, or other abdominal pain.  She denies other complaints.  On exam, lungs are clear and chest is nontender.  Abdomen is nontender.  Normal bowel sounds.  A chaperone was utilized and a pelvic exam was performed.  Patient had a white discharge but otherwise did not have any tenderness in her cervix or any tenderness in the adnexa.  No bleeding was seen.  No other ulcers or wounds seen.  Exam otherwise unremarkable.  Patient had swab sent including a GC/committee test which was sent out.  A wet prep revealed BV with no yeast.  She will be treated with Flagyl and she requested the tablet form.  She did not have tenderness on her cervix and did not have any other changes so we agreed to hold on empiric antibiotic treatment for other possible STI until the test returned.  She will follow-up on these results with her PCP and with my chart.  Given her likely tenderness, also agreed to hold on any ultrasound.  Her pregnancy test  was negative.  Urinalysis did not show UTI.  Patient we discharged with prescription for Flagyl and will follow up with PCP and OB/GYN.  Patient discharged in good condition with understanding return precautions and follow-up instructions.   Final Clinical Impression(s) / ED Diagnoses Final diagnoses:  BV (bacterial vaginosis)  Vaginal discharge  Vaginal itching    Rx / DC Orders ED Discharge Orders         Ordered    metroNIDAZOLE (FLAGYL) 500 MG tablet  2 times daily        05/15/20 0945         Clinical Impression: 1. BV (bacterial vaginosis)   2. Vaginal discharge   3. Vaginal itching     Disposition: Discharge  Condition: Good  I have discussed the results, Dx and Tx plan with the pt(& family if present). He/she/they expressed understanding and agree(s) with the plan. Discharge instructions discussed at great length. Strict return precautions discussed and pt &/or family have verbalized understanding of the instructions. No further questions at time of discharge.    New Prescriptions   METRONIDAZOLE (FLAGYL) 500 MG TABLET    Take 1 tablet (500 mg total) by mouth 2 (two) times daily for 7 days.    Follow Up: [place] [id number] [phone] Schedule an appointment as soon as possible for a visit    [facility] [place] [phone] [place]       Cordarrell Sane, Gwenyth Allegra, MD 05/15/20 1625

## 2020-05-15 NOTE — Discharge Instructions (Signed)
Your history, exam, work-up today are consistent with bacterial vaginosis recurrence.  We did not see evidence of other infection on exam and do not have tenderness that we felt would benefit from ultrasound today.  Please follow-up on the results of the GC and Chlamydia testing.  If any symptoms change or worsen, return to nearest emergency department.

## 2020-05-15 NOTE — ED Triage Notes (Signed)
Pt presents with c/o vaginal discharge and irritation that started 3 days ago. Pt reports the discharge is white in color and has a mild odor to it. Pt reports she believes it to be a yeast infection but that normally when this happens, the infection progresses to [place] as she is on Depo for [facility].

## 2020-05-16 LAB — URINE CULTURE: Culture: <10000 — AB

## 2020-06-20 ENCOUNTER — Telehealth: Payer: Self-pay | Admitting: General Practice

## 2020-06-20 NOTE — Telephone Encounter (Signed)
Called patient per her request via mychart. Patient states she has been on depo since she was 18 for painful periods but is thinking about getting pregnant. She is concerned about the potential of her periods being painful again and what will happen if she stops the depo. Patient is wondering if there is something else that she could maybe do or try instead. Patient also asked when she could get pregnant if she stopped depo now. Discussed with patient her depo is good until 1/28-2/11, after that time her depo would not cover against birth control any longer. Told patient it could take several months for the hormones to be out of her system completely to where they no longer affect her body's own hormones/periods. Advised she not stop taking depo injections unless she is ready to get pregnant at that time. Patient verbalized understanding and asked about her periods. Told patient there isn't a way to know what will happen with her periods until she stops taking the depo and maybe her cycles aren't as painful anymore but we couldn't know for certain until she stops the injections. Patient verbalized understanding and requested a mychart message with the same information. Told patient I would send that.

## 2020-07-15 ENCOUNTER — Encounter: Payer: Self-pay | Admitting: *Deleted

## 2020-07-15 ENCOUNTER — Ambulatory Visit (INDEPENDENT_AMBULATORY_CARE_PROVIDER_SITE_OTHER): Payer: Medicaid Other | Admitting: *Deleted

## 2020-07-15 ENCOUNTER — Other Ambulatory Visit: Payer: Self-pay

## 2020-07-15 VITALS — BP 115/62 | HR 89 | Ht 67.0 in | Wt 124.6 lb

## 2020-07-15 DIAGNOSIS — Z3042 Encounter for surveillance of injectable contraceptive: Secondary | ICD-10-CM

## 2020-07-15 MED ORDER — MEDROXYPROGESTERONE ACETATE 150 MG/ML IM SUSP
150.0000 mg | Freq: Once | INTRAMUSCULAR | Status: AC
  Administered 2020-07-15: 150 mg via INTRAMUSCULAR

## 2020-07-15 NOTE — Progress Notes (Signed)
Depo Provera 150 mg IM administered as scheduled. Pt tolerated well. Next injection due 4/15-4/29. Last Pap done 11/27/19. Next Annual Gyn exam due 12/2020. Pt voiced understanding of all information provided.

## 2020-07-25 ENCOUNTER — Other Ambulatory Visit: Payer: Self-pay

## 2020-07-25 MED ORDER — ACYCLOVIR 200 MG/5ML PO SUSP: 200.0000 mg | Freq: Two times a day (BID) | ORAL | 0 refills | Status: DC

## 2020-08-22 ENCOUNTER — Other Ambulatory Visit: Payer: Self-pay | Admitting: Obstetrics & Gynecology

## 2020-08-22 DIAGNOSIS — B009 Herpesviral infection, unspecified: Secondary | ICD-10-CM

## 2020-08-27 ENCOUNTER — Other Ambulatory Visit: Payer: Self-pay

## 2020-08-27 ENCOUNTER — Encounter (HOSPITAL_COMMUNITY): Payer: Self-pay | Admitting: Emergency Medicine

## 2020-08-27 ENCOUNTER — Emergency Department (HOSPITAL_COMMUNITY)
Admission: EM | Admit: 2020-08-27 | Discharge: 2020-08-27 | Disposition: A | Discharge status: Home / Self Care | Payer: Medicaid Other | Attending: Emergency Medicine | Admitting: Emergency Medicine

## 2020-08-27 DIAGNOSIS — W540XXA Bitten by dog, initial encounter: Secondary | ICD-10-CM | POA: Insufficient documentation

## 2020-08-27 DIAGNOSIS — Z87891 Personal history of nicotine dependence: Secondary | ICD-10-CM | POA: Insufficient documentation

## 2020-08-27 DIAGNOSIS — Z91040 Latex allergy status: Secondary | ICD-10-CM | POA: Insufficient documentation

## 2020-08-27 DIAGNOSIS — S51852A Open bite of left forearm, initial encounter: Secondary | ICD-10-CM | POA: Insufficient documentation

## 2020-08-27 MED ORDER — AMOXICILLIN-POT CLAVULANATE 400-57 MG/5ML PO SUSR: 875.0000 mg | Freq: Two times a day (BID) | ORAL | 0 refills | Status: AC

## 2020-08-27 NOTE — Discharge Instructions (Signed)
  Wound Care - General Wound Cleaning: Clean the wound and surrounding area gently with tap water and mild soap. Rinse well and blot dry.   Clean the wound daily to prevent infection.  Do not use cleaners such as hydrogen peroxide or alcohol.   Scar reduction: Application of a topical antibiotic ointment, such as Neosporin, after the wound has begun to close and heal well can decrease scab formation and reduce scarring. After the wound has healed, application of ointments such as Aquaphor can also reduce scar formation.  The key to scar reduction is keeping the skin well hydrated and supple. Drinking plenty of water throughout the day (At least eight 8oz glasses of water a day) is essential to staying well hydrated.  Sun exposure: Keep the wound out of the sun. After the wound has healed, continue to protect it from the sun by wearing protective clothing or applying sunscreen.  Pain: You may use Tylenol, naproxen, or ibuprofen for pain. Antiinflammatory medications: Take 600 mg of ibuprofen every 6 hours or 440 mg (over the counter dose) to 500 mg (prescription dose) of naproxen every 12 hours for the next 3 days. After this time, these medications may be used as needed for pain. Take these medications with food to avoid upset stomach. Choose only one of these medications, do not take them together. Acetaminophen (generic for Tylenol): Should you continue to have additional pain while taking the ibuprofen or naproxen, you may add in acetaminophen as needed. Your daily total maximum amount of acetaminophen from all sources should be limited to 4000mg /day for persons without liver problems, or 2000mg /day for those with liver problems. Please take all of your antibiotics until finished!   You may develop abdominal discomfort or diarrhea from the antibiotic.  You may help offset this with probiotics which you can buy or get in yogurt. Do not eat or take the probiotics until 2 hours after your antibiotic.   Return: Return to the ED should signs of infection arise, such as spreading redness, puffiness/swelling, pus draining from the wound, severe increase in pain, fever over 100.30F, or any other major issues.  For prescription assistance, may try using prescription discount sites or apps, such as goodrx.com

## 2020-08-27 NOTE — ED Provider Notes (Signed)
[facility] DEPT Provider Note   CSN: [id number] Arrival date & time: 08/27/20  1657     History Chief Complaint  Patient presents with  . Animal Bite    Sheri Parker is a 25 y.o. female.  HPI      Sheri Parker is a 25 y.o. female, presenting to the ED with dog bite that occurred around 2:30 PM this afternoon. Patient states she was trying to get her dog into his kennel when the dog bit her left forearm. She endorses wounds to the left forearm with some intermittent tingling to the left hand. Her dog is up-to-date on his vaccinations, including rabies. She had tetanus vaccination updated in 2018.  Denies weakness, other injuries.  Past Medical History:  Diagnosis Date  . 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 Medical history non-contributory   . Pollen allergies     Patient Active Problem List   Diagnosis Date Noted  . 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 Dizzy spells 01/04/2015  . Moderate headache 01/04/2015  . Left ovarian cyst 07/02/2014    Past Surgical History:  Procedure Laterality Date  . EYE MUSCLE SURGERY     x 2 one left and one right - lazy eye  . EYE SURGERY    . LAPAROSCOPIC UNILATERAL SALPINGO OOPHERECTOMY Left 07/02/2014   Procedure: LAPAROSCOPIC LEFT OOPHERECTOMY;  Surgeon: Mora Bellman, MD;  Location: [facility];  Service: Gynecology;  Laterality: Left;  . OVARIAN CYST REMOVAL  12/2015     OB History    Gravida  0   Para  0   Term  0   Preterm  0   AB  0   Living  0     SAB  0   IAB  0   Ectopic  0   Multiple  0   Live Births              Family History  Problem Relation Age of Onset  . Diabetes Mother   . Diabetes Father   . Cancer Maternal Grandmother     Social History   Tobacco Use  . Smoking status: Former Smoker    Types: E-cigarettes  . Smokeless tobacco: Never Used  Vaping Use  . Vaping Use: Former  Substance Use Topics  .  Alcohol use: No  . Drug use: No    Home Medications Prior to Admission medications   Medication Sig Start Date End Date Taking? Authorizing Provider  amoxicillin-clavulanate (AUGMENTIN) 400-57 MG/5ML suspension Take 10.9 mLs (875 mg total) by mouth 2 (two) times daily for 5 days. 08/27/20 09/01/20 Yes Joy, Shawn C, PA-C  acyclovir (ZOVIRAX) 200 MG/5ML suspension Take 20 mLs (800 mg total) by mouth 2 (two) times daily for 5 days. For any epidose 08/22/20 08/27/20  Anyanwu, Sallyanne Havers, MD    Allergies    Latex and Other  Review of Systems   Review of Systems  Skin: Positive for wound.  Neurological: Negative for weakness.       Left hand tingling    Physical Exam Updated Vital Signs BP 134/86 (BP Location: Right Arm)   Pulse 83   Temp 97.6 F (36.4 C) (Oral)   Resp 18   SpO2 100%   Physical Exam Vitals and nursing note reviewed.  Constitutional:      General: She is not in acute distress.    Appearance: She is well-developed. She is not diaphoretic.  HENT:  Head: Normocephalic and atraumatic.  Eyes:     Conjunctiva/sclera: Conjunctivae normal.  Cardiovascular:     Rate and Rhythm: Normal rate and regular rhythm.  Pulmonary:     Effort: Pulmonary effort is normal.  Musculoskeletal:     Cervical back: Neck supple.     Comments: Small puncture versus abrasion wounds to the left forearm, as shown in the photos.   No surrounding swelling or bruising. No tenderness beyond the immediate bite wounds themselves.  Specifically, no bony tenderness, deformity, or instability. No tenderness, deformity, instability, pain with range of motion in the left elbow, wrist, or hand.  Skin:    General: Skin is warm and dry.     Capillary Refill: Capillary refill takes less than 2 seconds.     Coloration: Skin is not pale.  Neurological:     Mental Status: She is alert.     Comments: Sensation grossly intact to light touch through each of the nerve distributions of the bilateral upper  extremities. Abduction and adduction of the fingers intact against resistance. Grip strength equal bilaterally. Supination and pronation intact against resistance. Strength 5/5 through the cardinal directions of the bilateral wrists. Strength 5/5 with flexion and extension of the bilateral elbows. Patient can touch the thumb to each one of the fingertips without difficulty.  Patient can hold the "OK" sign against resistance.  Psychiatric:        Behavior: Behavior normal.             ED Results / Procedures / Treatments   Labs (all labs ordered are listed, but only abnormal results are displayed) Labs Reviewed - No data to display  EKG None  Radiology No results found.  Procedures Procedures   Medications Ordered in ED Medications - No data to display  ED Course  I have reviewed the triage vital signs and the nursing notes.  Pertinent labs & imaging results that were available during my care of the patient were reviewed by me and considered in my medical decision making (see chart for details).    MDM Rules/Calculators/A&P                          Patient presents for evaluation following a dog bite.  Patient and dog both up to date on relevant vaccination. No evidence of neurovascular compromise.  No bony tenderness or deformity. Wounds were washed out here in the ED. Initiated appropriate antibiotic therapy.  Patient requested liquid form of antibiotic. The patient was given instructions for home care as well as return precautions. Patient voices understanding of these instructions, accepts the plan, and is comfortable with discharge.    Final Clinical Impression(s) / ED Diagnoses Final diagnoses:  Dog bite, initial encounter    Rx / DC Orders ED Discharge Orders         Ordered    amoxicillin-clavulanate (AUGMENTIN) 400-57 MG/5ML suspension  2 times daily        08/27/20 1802           Lorayne Bender, PA-C 08/27/20 1842    Carmin Muskrat,  MD 08/27/20 2119

## 2020-08-27 NOTE — ED Triage Notes (Signed)
Patient reports her dog "snapped" today. Abrasion to left forearm. States dog's vaccines UTD.

## 2020-09-30 ENCOUNTER — Ambulatory Visit: Payer: Medicaid Other

## 2020-10-03 ENCOUNTER — Ambulatory Visit (INDEPENDENT_AMBULATORY_CARE_PROVIDER_SITE_OTHER): Payer: Medicaid Other | Admitting: *Deleted

## 2020-10-03 ENCOUNTER — Encounter: Payer: Self-pay | Admitting: *Deleted

## 2020-10-03 VITALS — BP 120/79 | HR 71 | Ht 67.0 in

## 2020-10-03 DIAGNOSIS — Z3042 Encounter for surveillance of injectable contraceptive: Secondary | ICD-10-CM | POA: Diagnosis not present

## 2020-10-03 NOTE — Progress Notes (Signed)
Depo provera 150 mg IM administered as scheduled. Pt tolerated well. Next injection due 7/4-7/18. Pt had no c/o today. Last Pap done 11/27/19 (normal). Next Annual Gyn exam due after 11/26/20. Pt voiced understanding of information provided.

## 2020-10-15 NOTE — Progress Notes (Signed)
Chart reviewed for nurse visit. Agree with plan of care.   Starr Lake, Meagher 10/15/2020 9:41 PM

## 2020-11-10 ENCOUNTER — Encounter (HOSPITAL_COMMUNITY): Payer: Self-pay | Admitting: Emergency Medicine

## 2020-11-10 ENCOUNTER — Emergency Department (HOSPITAL_COMMUNITY)
Admission: EM | Admit: 2020-11-10 | Discharge: 2020-11-10 | Disposition: A | Discharge status: Home / Self Care | Payer: Medicaid Other | Attending: Emergency Medicine | Admitting: Emergency Medicine

## 2020-11-10 DIAGNOSIS — Z91040 Latex allergy status: Secondary | ICD-10-CM | POA: Insufficient documentation

## 2020-11-10 DIAGNOSIS — Z87891 Personal history of nicotine dependence: Secondary | ICD-10-CM | POA: Insufficient documentation

## 2020-11-10 DIAGNOSIS — M79642 Pain in left hand: Secondary | ICD-10-CM | POA: Insufficient documentation

## 2020-11-10 DIAGNOSIS — M6740 Ganglion, unspecified site: Secondary | ICD-10-CM

## 2020-11-10 NOTE — ED Provider Notes (Signed)
[facility] DEPT Provider Note   CSN: [id number] Arrival date & time: 11/10/20  1120     History Chief Complaint  Patient presents with  . Hand Pain    Sheri Parker is a 25 y.o. 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  Patient presents with chief complaint of "Ball," to the dorsum of the left hand.  Patient reports that this "ball," intermittently arises and resolve spontaneously.  Patient complains of pain associated with this mass.  Patient states this is been ongoing for the last year.  Patient reports that she believes that repetitive motions at work make the mass appear more frequently.  Patient endorses pain to dorsum of left hand at present.  Patient rates his pain 6/10 on pain scale.  Pain is worse with movement and improved with rest.  Patient reports that she does not have "ball," to her hand at present.  Patient denies any numbness weakness, wound, rash, color change, or pallor.  Patient denies any injury.  Patient is right-hand dominant.  HPI     Past Medical History:  Diagnosis Date  . 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 Medical history non-contributory   . Pollen allergies     Patient Active Problem List   Diagnosis Date Noted  . 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 Dizzy spells 01/04/2015  . Moderate headache 01/04/2015  . Left ovarian cyst 07/02/2014    Past Surgical History:  Procedure Laterality Date  . EYE MUSCLE SURGERY     x 2 one left and one right - lazy eye  . EYE SURGERY    . LAPAROSCOPIC UNILATERAL SALPINGO OOPHERECTOMY Left 07/02/2014   Procedure: LAPAROSCOPIC LEFT OOPHERECTOMY;  Surgeon: Mora Bellman, MD;  Location: [facility];  Service: Gynecology;  Laterality: Left;  . OVARIAN CYST REMOVAL  12/2015     OB History    Gravida  0   Para  0   Term  0   Preterm  0   AB  0   Living  0     SAB  0   IAB  0   Ectopic  0    Multiple  0   Live Births              Family History  Problem Relation Age of Onset  . Diabetes Mother   . Diabetes Father   . Cancer Maternal Grandmother     Social History   Tobacco Use  . Smoking status: Former Smoker    Types: E-cigarettes  . Smokeless tobacco: Never Used  Vaping Use  . Vaping Use: Former  Substance Use Topics  . Alcohol use: No  . Drug use: No    Home Medications Prior to Admission medications   Not on File    Allergies    Latex and Other  Review of Systems   Review of Systems  Musculoskeletal: Positive for myalgias. Negative for joint swelling.  Skin: Negative for color change, pallor, rash and wound.  Neurological: Negative for weakness and numbness.    Physical Exam Updated Vital Signs BP (!) 135/113 (BP Location: Left Arm)   Pulse 86   Temp 98.5 F (36.9 C) (Oral)   Resp 18   SpO2 97%   Physical Exam Vitals and nursing note reviewed.  Constitutional:      General: She is not in acute distress.    Appearance: She is not ill-appearing, toxic-appearing  or diaphoretic.  HENT:     Head: Normocephalic.  Eyes:     General: No scleral icterus.       Right eye: No discharge.        Left eye: No discharge.  Cardiovascular:     Rate and Rhythm: Normal rate.  Pulmonary:     Effort: Pulmonary effort is normal.  Musculoskeletal:     Left wrist: No swelling, deformity, effusion, lacerations, tenderness, bony tenderness, snuff box tenderness or crepitus. Normal range of motion. Normal pulse.     Left hand: Tenderness and bony tenderness present. No swelling, deformity or lacerations. Normal range of motion. Normal strength. Normal sensation. Normal capillary refill.     Comments: Patient complains of diffuse tenderness to dorsum of left hand.  No swelling, deformity, laceration, or mass observed.  +3 left radial pulse  Skin:    General: Skin is warm and dry.  Neurological:     General: No focal deficit present.     Mental Status:  She is alert.  Psychiatric:        Behavior: Behavior is cooperative.     ED Results / Procedures / Treatments   Labs (all labs ordered are listed, but only abnormal results are displayed) Labs Reviewed - No data to display  EKG None  Radiology No results found.  Procedures Procedures   Medications Ordered in ED Medications - No data to display  ED Course  I have reviewed the triage vital signs and the nursing notes.  Pertinent labs & imaging results that were available during my care of the patient were reviewed by me and considered in my medical decision making (see chart for details).    MDM Rules/Calculators/A&P                          Alert 25 year old female no acute distress, nontoxic-appearing.  Patient presents with chief plaint of "ball," to dorsum of left hand.  Patient reports this mass has been intermittent over the last year.  Patient reports no "fbll," there at present.  Patient also endorses pain to dorsum of left hand.  Patient denies any numbness, weakness, injury, wound, color change, rash or pallor.  Patient complains of diffuse tenderness to dorsum of left hand.  No swelling, deformity, laceration, or mass observed.  +3 left radial pulse  Patient was offered x-ray imaging of left hand however refuses at this time.  Suspect possible ganglion cyst as cause of "ball," that patient is experiencing.  Patient advised to follow-up with primary care provider if soft tissue mass returns and is bothersome.  Patient given strict return precautions.  Patient expressed understanding of all instructions and is agreeable with this plan.  Final Clinical Impression(s) / ED Diagnoses Final diagnoses:  Left hand pain    Rx / DC Orders ED Discharge Orders    None       Loni Beckwith, PA-C 11/11/20 0315    Daleen Bo, MD 11/11/20 [phone]

## 2020-11-10 NOTE — Discharge Instructions (Addendum)
He came to the emergency room today to be evaluated for your left hand pain and mass.  Your physical exam was reassuring.  I suspect that you are mass is a ganglion cyst.  Please read attached paperwork on ganglion cyst.  Please follow-up with your primary care provider if this continues to bother you.  Get help right away if: You have a fever and have any of these in the cyst area: Increased redness. Red streaks. Swelling.

## 2020-11-10 NOTE — ED Triage Notes (Signed)
Patient reports left hand pain for "a very long time." States hx carpel tunnel. Reports has not followed up with ortho.

## 2020-11-29 ENCOUNTER — Other Ambulatory Visit: Payer: Self-pay | Admitting: Lactation Services

## 2020-11-29 MED ORDER — ACYCLOVIR 200 MG/5ML PO SUSP: 800.0000 mg | Freq: Two times a day (BID) | ORAL | 99 refills | Status: DC

## 2020-11-29 NOTE — Progress Notes (Signed)
Patient reports HSV outbreak and out of refills. Reordered per standing order.

## 2020-12-06 ENCOUNTER — Other Ambulatory Visit: Payer: Self-pay | Admitting: Obstetrics & Gynecology

## 2021-01-08 ENCOUNTER — Other Ambulatory Visit: Payer: Self-pay | Admitting: Obstetrics & Gynecology

## 2021-03-08 ENCOUNTER — Encounter: Payer: Self-pay | Admitting: Family Medicine

## 2021-03-08 ENCOUNTER — Other Ambulatory Visit (HOSPITAL_COMMUNITY)
Admission: RE | Admit: 2021-03-08 | Discharge: 2021-03-08 | Disposition: A | Discharge status: Home / Self Care | Payer: Medicaid Other | Source: Ambulatory Visit | Attending: Family Medicine | Admitting: Family Medicine

## 2021-03-08 ENCOUNTER — Ambulatory Visit (INDEPENDENT_AMBULATORY_CARE_PROVIDER_SITE_OTHER): Payer: Medicaid Other | Admitting: Family Medicine

## 2021-03-08 ENCOUNTER — Other Ambulatory Visit: Payer: Self-pay

## 2021-03-08 VITALS — BP 118/81 | HR 81 | Wt 125.1 lb

## 2021-03-08 DIAGNOSIS — Z113 Encounter for screening for infections with a predominantly sexual mode of transmission: Secondary | ICD-10-CM | POA: Diagnosis not present

## 2021-03-08 DIAGNOSIS — Z3042 Encounter for surveillance of injectable contraceptive: Secondary | ICD-10-CM | POA: Diagnosis not present

## 2021-03-08 DIAGNOSIS — Z3202 Encounter for pregnancy test, result negative: Secondary | ICD-10-CM

## 2021-03-08 DIAGNOSIS — Z01419 Encounter for gynecological examination (general) (routine) without abnormal findings: Secondary | ICD-10-CM | POA: Diagnosis not present

## 2021-03-08 LAB — POCT PREGNANCY, URINE: Preg Test, Ur: NEGATIVE

## 2021-03-08 MED ORDER — MEDROXYPROGESTERONE ACETATE 150 MG/ML IM SUSP
150.0000 mg | Freq: Once | INTRAMUSCULAR | Status: AC
  Administered 2021-03-08: 150 mg via INTRAMUSCULAR

## 2021-03-08 NOTE — Progress Notes (Signed)
   GYNECOLOGY OFFICE VISIT NOTE  History:  25 y.o. G0P0000 here today for annual gyn exam and Depoprovera. She denies any abnormal vaginal discharge, bleeding, pelvic pain or other concerns.   #Contraception  - last Depo on 10/03/2020 - LMP: now (9/20) - UPT negative today in clinic  #Health maintenance - would like STI testing - also would like breast exam - has family hx of diabetes but due to insurance would only like blood work for STI screening  The following portions of the patient's history were reviewed and updated as appropriate: allergies, current medications, past family history, past medical history, past social history, past surgical history and problem list.   Health Maintenance:  Normal pap and negative HRHPV on 11/2019.    Review of Systems:  Pertinent items noted in HPI Review of Systems  All other systems reviewed and are negative.  Objective:  Physical Exam BP 118/81   Pulse 81   Wt 125 lb 1.6 oz (56.7 kg)   BMI 19.59 kg/m  Physical Exam Vitals and nursing note reviewed. Exam conducted with a chaperone present.  Constitutional:      Appearance: Normal appearance.  Cardiovascular:     Rate and Rhythm: Normal rate.     Pulses: Normal pulses.  Chest:     Comments: Breast exam done per patient request. No evidence of changes or abnormalities of breast tissue Abdominal:     General: There is no distension.     Tenderness: There is no abdominal tenderness.  Skin:    General: Skin is warm.  Neurological:     General: No focal deficit present.     Mental Status: She is alert and oriented to person, place, and time.  Psychiatric:        Mood and Affect: Mood normal.    Labs and Imaging Results for orders placed or performed in visit on 03/08/21 (from the past 168 hour(s))  Pregnancy, urine POC   Collection Time: 03/08/21 11:52 AM  Result Value Ref Range   Preg Test, Ur NEGATIVE NEGATIVE   No results found.  Assessment & Plan:  1. Screening  examination for STD (sexually transmitted disease) - Cervicovaginal ancillary only( [facility]) - HIV antibody (with reflex) - RPR  2. Surveillance for Depo-Provera contraception Urine pregnancy test negative. LMP 03/07/2021 - medroxyPROGESTERone (DEPO-PROVERA) injection 150 mg  3. Encounter for annual routine gynecological examination Up to date on PAP. No abnormalities on breast exam today. Likely due for a1c and lipids however due to only having Family Planning medicaid will defer at this time per patient preference.    Routine preventative health maintenance measures emphasized. Please refer to After Visit Summary for other counseling recommendations.     Total face-to-face time with patient: 30 minutes.  Over 50% of encounter was spent on counseling and coordination of care.  Renard Matter, MD, MPH OB Fellow, [place] for [facility], [place]

## 2021-03-08 NOTE — Progress Notes (Signed)
Patient is here for Depo Porvera injection. UPT was negative. Depo was administered into left upper outer quadrant without any complications. Patient will return for next injection between December 7 and June 07, 2021. Patient verbalized understanding and had no questions or concerns.    Katherene Dinino, [place]    03/08/21  12:07 pm

## 2021-03-09 LAB — CERVICOVAGINAL ANCILLARY ONLY
Bacterial Vaginitis (gardnerella): POSITIVE — AB
Candida Glabrata: NEGATIVE
Candida Vaginitis: POSITIVE — AB
Chlamydia: NEGATIVE
Comment: NEGATIVE
Comment: NEGATIVE
Comment: NEGATIVE
Comment: NEGATIVE
Comment: NEGATIVE
Comment: NORMAL
Neisseria Gonorrhea: NEGATIVE
Trichomonas: NEGATIVE

## 2021-03-09 LAB — HIV ANTIBODY (ROUTINE TESTING W REFLEX): HIV Screen 4th Generation wRfx: NONREACTIVE

## 2021-03-09 LAB — RPR: RPR Ser Ql: NONREACTIVE

## 2021-03-10 MED ORDER — METRONIDAZOLE 500 MG PO TABS: 500.0000 mg | ORAL_TABLET | Freq: Two times a day (BID) | ORAL | 0 refills | Status: DC

## 2021-03-10 MED ORDER — FLUCONAZOLE 150 MG PO TABS: 150.0000 mg | ORAL_TABLET | Freq: Once | ORAL | 0 refills | Status: AC

## 2021-05-24 ENCOUNTER — Ambulatory Visit (INDEPENDENT_AMBULATORY_CARE_PROVIDER_SITE_OTHER): Payer: Medicaid Other

## 2021-05-24 ENCOUNTER — Other Ambulatory Visit: Payer: Self-pay

## 2021-05-24 ENCOUNTER — Encounter: Payer: Self-pay | Admitting: Family Medicine

## 2021-05-24 ENCOUNTER — Other Ambulatory Visit (HOSPITAL_COMMUNITY)
Admission: RE | Admit: 2021-05-24 | Discharge: 2021-05-24 | Disposition: A | Discharge status: Home / Self Care | Payer: Medicaid Other | Source: Ambulatory Visit | Attending: Family Medicine | Admitting: Family Medicine

## 2021-05-24 DIAGNOSIS — Z113 Encounter for screening for infections with a predominantly sexual mode of transmission: Secondary | ICD-10-CM | POA: Insufficient documentation

## 2021-05-24 DIAGNOSIS — Z3042 Encounter for surveillance of injectable contraceptive: Secondary | ICD-10-CM | POA: Diagnosis not present

## 2021-05-24 MED ORDER — MEDROXYPROGESTERONE ACETATE 150 MG/ML IM SUSP
150.0000 mg | Freq: Once | INTRAMUSCULAR | Status: AC
  Administered 2021-05-24: 150 mg via INTRAMUSCULAR

## 2021-05-24 NOTE — Progress Notes (Signed)
Sheri Parker here for Depo-Provera Injection. Injection administered without complication. Patient will return in 3 months for next injection between Feb 22 and March 8,2023. Next annual visit due Sept 2023. Pt also request STD testing. Denies any symptoms at this time. Self swab collected today. Pt advised results will take 24-48 hours and will see results in mychart and will be notified if needs further treatment. Pt verbalized understanding   [name], RN 05/24/2021  9:39 AM

## 2021-05-25 NOTE — Progress Notes (Signed)
Patient was assessed and managed by nursing staff during this encounter. I have reviewed the chart and agree with the documentation and plan. I have also made any necessary editorial changes.  Griffin Basil, MD 05/25/2021 8:51 AM

## 2021-05-26 LAB — CERVICOVAGINAL ANCILLARY ONLY
Bacterial Vaginitis (gardnerella): POSITIVE — AB
Candida Glabrata: NEGATIVE
Candida Vaginitis: NEGATIVE
Chlamydia: NEGATIVE
Comment: NEGATIVE
Comment: NEGATIVE
Comment: NEGATIVE
Comment: NEGATIVE
Comment: NEGATIVE
Comment: NORMAL
Neisseria Gonorrhea: NEGATIVE
Trichomonas: NEGATIVE

## 2021-05-29 ENCOUNTER — Other Ambulatory Visit: Payer: Self-pay

## 2021-05-29 MED ORDER — METRONIDAZOLE 500 MG PO TABS: 500.0000 mg | ORAL_TABLET | Freq: Two times a day (BID) | ORAL | 0 refills | Status: DC

## 2021-08-09 ENCOUNTER — Other Ambulatory Visit: Payer: Self-pay

## 2021-08-09 ENCOUNTER — Ambulatory Visit: Payer: Medicaid Other

## 2021-08-17 ENCOUNTER — Ambulatory Visit (INDEPENDENT_AMBULATORY_CARE_PROVIDER_SITE_OTHER): Payer: 59

## 2021-08-17 ENCOUNTER — Other Ambulatory Visit (HOSPITAL_COMMUNITY)
Admission: RE | Admit: 2021-08-17 | Discharge: 2021-08-17 | Disposition: A | Discharge status: Home / Self Care | Payer: 59 | Source: Ambulatory Visit | Attending: Family Medicine | Admitting: Family Medicine

## 2021-08-17 ENCOUNTER — Other Ambulatory Visit: Payer: Self-pay

## 2021-08-17 VITALS — BP 124/68 | HR 70 | Wt 126.6 lb

## 2021-08-17 DIAGNOSIS — Z113 Encounter for screening for infections with a predominantly sexual mode of transmission: Secondary | ICD-10-CM | POA: Insufficient documentation

## 2021-08-17 DIAGNOSIS — Z3042 Encounter for surveillance of injectable contraceptive: Secondary | ICD-10-CM

## 2021-08-17 MED ORDER — MEDROXYPROGESTERONE ACETATE 150 MG/ML IM SUSP
150.0000 mg | Freq: Once | INTRAMUSCULAR | Status: AC
  Administered 2021-08-17: 150 mg via INTRAMUSCULAR

## 2021-08-17 NOTE — Progress Notes (Signed)
Sheri Parker here for Depo-Provera Injection. Injection administered without complication. Patient will return in 3 months for next injection between May 18 and June 1,2023. Next annual visit due Sept 2023.  ? ?[name], RN ?08/17/2021  9:57 AM ? ?

## 2021-08-18 LAB — CERVICOVAGINAL ANCILLARY ONLY
Bacterial Vaginitis (gardnerella): POSITIVE — AB
Candida Glabrata: NEGATIVE
Candida Vaginitis: POSITIVE — AB
Chlamydia: NEGATIVE
Comment: NEGATIVE
Comment: NEGATIVE
Comment: NEGATIVE
Comment: NEGATIVE
Comment: NEGATIVE
Comment: NORMAL
Neisseria Gonorrhea: NEGATIVE
Trichomonas: NEGATIVE

## 2021-08-21 ENCOUNTER — Other Ambulatory Visit: Payer: Self-pay | Admitting: Advanced Practice Midwife

## 2021-08-21 MED ORDER — FLUCONAZOLE 150 MG PO TABS: 150.0000 mg | ORAL_TABLET | Freq: Once | ORAL | 0 refills | Status: AC

## 2021-08-21 MED ORDER — METRONIDAZOLE 500 MG PO TABS: 500.0000 mg | ORAL_TABLET | Freq: Two times a day (BID) | ORAL | 0 refills | Status: DC

## 2021-08-21 NOTE — Progress Notes (Signed)
+   BV, + yeast. Will notify via MyChart ? ?Mallie Snooks, MSA, MSN, CNM ?Certified Nurse Midwife, [place] [facility] ? ?

## 2021-10-19 ENCOUNTER — Ambulatory Visit: Payer: 59

## 2021-10-26 ENCOUNTER — Ambulatory Visit: Payer: 59

## 2021-11-02 ENCOUNTER — Ambulatory Visit: Payer: 59

## 2021-11-02 ENCOUNTER — Ambulatory Visit (INDEPENDENT_AMBULATORY_CARE_PROVIDER_SITE_OTHER): Payer: 59 | Admitting: General Practice

## 2021-11-02 ENCOUNTER — Other Ambulatory Visit (HOSPITAL_COMMUNITY)
Admission: RE | Admit: 2021-11-02 | Discharge: 2021-11-02 | Disposition: A | Discharge status: Home / Self Care | Payer: 59 | Source: Ambulatory Visit | Attending: Family Medicine | Admitting: Family Medicine

## 2021-11-02 VITALS — BP 132/77 | HR 79 | Ht 67.0 in | Wt 127.0 lb

## 2021-11-02 DIAGNOSIS — Z113 Encounter for screening for infections with a predominantly sexual mode of transmission: Secondary | ICD-10-CM

## 2021-11-02 DIAGNOSIS — Z30013 Encounter for initial prescription of injectable contraceptive: Secondary | ICD-10-CM

## 2021-11-02 NOTE — Progress Notes (Signed)
Sheri Parker here for Depo-Provera Injection. Injection administered without complication. Patient will return in 3 months for next injection between 8/3 and 8/17. Next annual visit due September 2023. Patient desired STD testing today, declined blood testing. Patient was instructed in self swab & specimen collected. Discussed results will be back in 24-48 hours and released to mychart. Advised any needed prescriptions would be sent to pharmacy. Patient verbalized understanding.  Derinda Late, RN 11/02/2021  9:05 AM

## 2021-11-03 ENCOUNTER — Other Ambulatory Visit: Payer: Self-pay | Admitting: Family Medicine

## 2021-11-03 DIAGNOSIS — N760 Acute vaginitis: Secondary | ICD-10-CM

## 2021-11-03 LAB — CERVICOVAGINAL ANCILLARY ONLY
Bacterial Vaginitis (gardnerella): POSITIVE — AB
Candida Glabrata: NEGATIVE
Candida Vaginitis: NEGATIVE
Chlamydia: NEGATIVE
Comment: NEGATIVE
Comment: NEGATIVE
Comment: NEGATIVE
Comment: NEGATIVE
Comment: NEGATIVE
Comment: NORMAL
Neisseria Gonorrhea: NEGATIVE
Trichomonas: NEGATIVE

## 2021-11-03 MED ORDER — METRONIDAZOLE 500 MG PO TABS: 500.0000 mg | ORAL_TABLET | Freq: Two times a day (BID) | ORAL | 0 refills | Status: DC

## 2021-11-22 ENCOUNTER — Emergency Department (HOSPITAL_COMMUNITY): Payer: 59

## 2021-11-22 ENCOUNTER — Encounter (HOSPITAL_COMMUNITY): Payer: Self-pay

## 2021-11-22 ENCOUNTER — Emergency Department (HOSPITAL_COMMUNITY)
Admission: EM | Admit: 2021-11-22 | Discharge: 2021-11-22 | Disposition: A | Discharge status: Home / Self Care | Payer: 59 | Attending: Emergency Medicine | Admitting: Emergency Medicine

## 2021-11-22 DIAGNOSIS — Z91040 Latex allergy status: Secondary | ICD-10-CM | POA: Diagnosis not present

## 2021-11-22 DIAGNOSIS — Y92410 Unspecified street and highway as the place of occurrence of the external cause: Secondary | ICD-10-CM | POA: Insufficient documentation

## 2021-11-22 DIAGNOSIS — S161XXA Strain of muscle, fascia and tendon at neck level, initial encounter: Secondary | ICD-10-CM | POA: Diagnosis not present

## 2021-11-22 DIAGNOSIS — R519 Headache, unspecified: Secondary | ICD-10-CM | POA: Insufficient documentation

## 2021-11-22 DIAGNOSIS — S199XXA Unspecified injury of neck, initial encounter: Secondary | ICD-10-CM | POA: Diagnosis present

## 2021-11-22 MED ORDER — ACETAMINOPHEN 325 MG PO TABS
650.0000 mg | ORAL_TABLET | Freq: Once | ORAL | Status: AC
  Administered 2021-11-22: 650 mg via ORAL
  Filled 2021-11-22: qty 2
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
## 2021-11-29 ENCOUNTER — Ambulatory Visit (INDEPENDENT_AMBULATORY_CARE_PROVIDER_SITE_OTHER): Payer: 59

## 2021-11-29 ENCOUNTER — Other Ambulatory Visit (HOSPITAL_COMMUNITY)
Admission: RE | Admit: 2021-11-29 | Discharge: 2021-11-29 | Disposition: A | Discharge status: Home / Self Care | Payer: 59 | Source: Ambulatory Visit | Attending: Family Medicine | Admitting: Family Medicine

## 2021-11-29 VITALS — BP 125/85 | HR 76 | Wt 128.0 lb

## 2021-11-29 DIAGNOSIS — N898 Other specified noninflammatory disorders of vagina: Secondary | ICD-10-CM

## 2021-11-29 NOTE — Progress Notes (Signed)
Patient here today with complaint of increased vaginal discharge. I instructed patient on how to collect self swab. Self swab collected without issue. I explained to patient we will notify her with any abnormal results. Patient verbalized understanding.  Paulina Fusi, RN 11/29/21

## 2021-12-01 ENCOUNTER — Encounter: Payer: Self-pay | Admitting: Family Medicine

## 2021-12-01 LAB — CERVICOVAGINAL ANCILLARY ONLY
Bacterial Vaginitis (gardnerella): POSITIVE — AB
Candida Glabrata: NEGATIVE
Candida Vaginitis: POSITIVE — AB
Chlamydia: NEGATIVE
Comment: NEGATIVE
Comment: NEGATIVE
Comment: NEGATIVE
Comment: NEGATIVE
Comment: NEGATIVE
Comment: NORMAL
Neisseria Gonorrhea: NEGATIVE
Trichomonas: NEGATIVE

## 2021-12-02 ENCOUNTER — Other Ambulatory Visit: Payer: Self-pay | Admitting: Certified Nurse Midwife

## 2021-12-02 ENCOUNTER — Encounter: Payer: Self-pay | Admitting: Family Medicine

## 2021-12-02 DIAGNOSIS — B3731 Acute candidiasis of vulva and vagina: Secondary | ICD-10-CM

## 2021-12-02 MED ORDER — FLUCONAZOLE 150 MG PO TABS: 150.0000 mg | ORAL_TABLET | Freq: Once | ORAL | 0 refills | Status: AC

## 2021-12-18 ENCOUNTER — Other Ambulatory Visit: Payer: Self-pay | Admitting: General Practice

## 2021-12-18 DIAGNOSIS — B9689 Other specified bacterial agents as the cause of diseases classified elsewhere: Secondary | ICD-10-CM

## 2021-12-18 MED ORDER — METRONIDAZOLE 500 MG PO TABS: 500.0000 mg | ORAL_TABLET | Freq: Two times a day (BID) | ORAL | 0 refills | Status: DC

## 2021-12-18 NOTE — Telephone Encounter (Signed)
Patient called into office requesting refill on Flagyl. States she never took the Rx last month as she accidentally threw it away. Rx refilled.

## 2022-01-09 ENCOUNTER — Encounter: Payer: Self-pay | Admitting: Family Medicine

## 2022-01-09 ENCOUNTER — Other Ambulatory Visit: Payer: Self-pay | Admitting: Obstetrics & Gynecology

## 2022-01-18 ENCOUNTER — Ambulatory Visit: Payer: 59

## 2022-01-18 ENCOUNTER — Other Ambulatory Visit (HOSPITAL_COMMUNITY)
Admission: RE | Admit: 2022-01-18 | Discharge: 2022-01-18 | Disposition: A | Discharge status: Home / Self Care | Payer: 59 | Source: Ambulatory Visit | Attending: Family Medicine | Admitting: Family Medicine

## 2022-01-18 ENCOUNTER — Ambulatory Visit (INDEPENDENT_AMBULATORY_CARE_PROVIDER_SITE_OTHER): Payer: 59 | Admitting: Family Medicine

## 2022-01-18 ENCOUNTER — Encounter: Payer: Self-pay | Admitting: Family Medicine

## 2022-01-18 VITALS — BP 122/70 | HR 73 | Wt 125.5 lb

## 2022-01-18 DIAGNOSIS — Z3042 Encounter for surveillance of injectable contraceptive: Secondary | ICD-10-CM

## 2022-01-18 DIAGNOSIS — Z113 Encounter for screening for infections with a predominantly sexual mode of transmission: Secondary | ICD-10-CM | POA: Insufficient documentation

## 2022-01-18 DIAGNOSIS — B009 Herpesviral infection, unspecified: Secondary | ICD-10-CM | POA: Diagnosis not present

## 2022-01-18 MED ORDER — MEDROXYPROGESTERONE ACETATE 150 MG/ML IM SUSP
150.0000 mg | Freq: Once | INTRAMUSCULAR | Status: AC
  Administered 2022-01-18: 150 mg via INTRAMUSCULAR

## 2022-01-18 MED ORDER — ACYCLOVIR 200 MG/5ML PO SUSP: ORAL | 5 refills | Status: DC

## 2022-01-18 MED ORDER — VALACYCLOVIR HCL 1 G PO TABS: 500.0000 mg | ORAL_TABLET | Freq: Every day | ORAL | 2 refills | Status: DC

## 2022-01-18 NOTE — Assessment & Plan Note (Signed)
To change to 500 mg po Valtrex daily for suppression and decreased risk of transmission and may use '1000mg'$  bid during outbreaks.

## 2022-01-18 NOTE — Progress Notes (Signed)
   Subjective:    Patient ID: Sheri Parker is a 26 y.o. female presenting with Gynecologic Exam  on 01/18/2022  HPI: Here today with recent HSV outbreak. Has previously taken liquid acyclovir during outbreaks without much relief. Outbreaks are small and are not very painful. She has trouble swallowing pills. Requests STD screen.    Review of Systems  Constitutional:  Negative for chills and fever.  Respiratory:  Negative for shortness of breath.   Cardiovascular:  Negative for chest pain.  Gastrointestinal:  Negative for abdominal pain, nausea and vomiting.  Genitourinary:  Negative for dysuria.  Skin:  Negative for rash.      Objective:    BP 122/70   Pulse 73   Wt 125 lb 8 oz (56.9 kg)   BMI 19.66 kg/m  Physical Exam Exam conducted with a chaperone present.  Constitutional:      General: She is not in acute distress.    Appearance: She is well-developed.  HENT:     Head: Normocephalic and atraumatic.  Eyes:     General: No scleral icterus. Cardiovascular:     Rate and Rhythm: Normal rate.  Pulmonary:     Effort: Pulmonary effort is normal.  Abdominal:     Palpations: Abdomen is soft.  Musculoskeletal:     Cervical back: Neck supple.  Skin:    General: Skin is warm and dry.  Neurological:     Mental Status: She is alert and oriented to person, place, and time.         Assessment & Plan:   Problem List Items Addressed This Visit       Unprioritized   HSV-2 infection    To change to 500 mg po Valtrex daily for suppression and decreased risk of transmission and may use '1000mg'$  bid during outbreaks.      Relevant Medications   valACYclovir (VALTREX) 1000 MG tablet   Other Visit Diagnoses     Screening for STD (sexually transmitted disease)    -  Primary   Culture and treat as needed   Relevant Orders   Cervicovaginal ancillary only( [facility])   Surveillance for Depo-Provera contraception       depo today   Relevant Medications   medroxyPROGESTERone  (DEPO-PROVERA) injection 150 mg (Completed)       Return in about 3 months (around 04/20/2022) for Depo Provera.  [name], MD 01/18/2022 10:23 AM

## 2022-01-19 LAB — CERVICOVAGINAL ANCILLARY ONLY
Chlamydia: NEGATIVE
Comment: NEGATIVE
Comment: NEGATIVE
Comment: NORMAL
Neisseria Gonorrhea: NEGATIVE
Trichomonas: NEGATIVE

## 2022-01-31 ENCOUNTER — Emergency Department (HOSPITAL_COMMUNITY)
Admission: EM | Admit: 2022-01-31 | Discharge: 2022-01-31 | Disposition: A | Discharge status: Home / Self Care | Payer: 59 | Attending: Emergency Medicine | Admitting: Emergency Medicine

## 2022-01-31 ENCOUNTER — Encounter (HOSPITAL_COMMUNITY): Payer: Self-pay | Admitting: Emergency Medicine

## 2022-01-31 DIAGNOSIS — L03011 Cellulitis of right finger: Secondary | ICD-10-CM | POA: Insufficient documentation

## 2022-01-31 MED ORDER — CEPHALEXIN 500 MG PO CAPS: 500.0000 mg | ORAL_CAPSULE | Freq: Three times a day (TID) | ORAL | 0 refills | Status: DC

## 2022-01-31 MED ORDER — LIDOCAINE HCL 2 % IJ SOLN
10.0000 mL | Freq: Once | INTRAMUSCULAR | Status: AC
  Administered 2022-01-31: 200 mg
  Filled 2022-01-31: qty 20

## 2022-01-31 NOTE — ED Triage Notes (Signed)
Pt reports swelling to the distal end of her R middle finger. States that it has worsened over the last 3 days. No injury, Reports that she did recently get her nails done.

## 2022-01-31 NOTE — Discharge Instructions (Signed)
You were seen today for an infection of the finger.  Continue to soak in warm soapy water.  Take antibiotics as prescribed.

## 2022-01-31 NOTE — ED Provider Notes (Signed)
[facility] DEPT Provider Note   CSN: [id number] Arrival date & time: 01/31/22  0354     History  Chief Complaint  Patient presents with   Hand Pain    R middle finger    Sheri Parker is a 26 y.o. female.  HPI     This is a 26 year old female who presents with right middle digit pain and swelling.  Patient recently had her nails done.  Since that time over the last 2 to 3 days she has had increasing pain and swelling adjacent to the fingernail.  She has not noted any spontaneous drainage.  No fevers.  No streaking or redness.  Denies injury  Home Medications Prior to Admission medications   Medication Sig Start Date End Date Taking? Authorizing Provider  cephALEXin (KEFLEX) 500 MG capsule Take 1 capsule (500 mg total) by mouth 3 (three) times daily. 01/31/22  Yes Derren Suydam, Barbette Hair, MD  medroxyPROGESTERone (DEPO-PROVERA) 150 MG/ML injection Inject 150 mg into the muscle every 3 (three) months.    [provider]  valACYclovir (VALTREX) 1000 MG tablet Take 0.5 tablets (500 mg total) by mouth daily. 01/18/22   [name], MD      Allergies    Latex and Other    Review of Systems   Review of Systems  Skin:  Negative for color change and wound.  All other systems reviewed and are negative.   Physical Exam Updated Vital Signs BP 120/85 (BP Location: Left Arm)   Pulse 76   Temp 98.6 F (37 C) (Oral)   Resp 16   SpO2 100%  Physical Exam Vitals and nursing note reviewed.  Constitutional:      Appearance: She is well-developed. She is not ill-appearing.  HENT:     Head: Normocephalic and atraumatic.  Eyes:     Pupils: Pupils are equal, round, and reactive to light.  Cardiovascular:     Rate and Rhythm: Normal rate and regular rhythm.  Pulmonary:     Effort: Pulmonary effort is normal. No respiratory distress.  Abdominal:     Palpations: Abdomen is soft.  Musculoskeletal:     Cervical back: Neck supple.     Comments: Focused  examination of the right hand with tenderness to palpation adjacent to the nailbed medially of the right third digit, there is fluctuance and induration noted, no significant redness or erythema  Skin:    General: Skin is warm and dry.  Neurological:     Mental Status: She is alert and oriented to person, place, and time.  Psychiatric:        Mood and Affect: Mood normal.     ED Results / Procedures / Treatments   Labs (all labs ordered are listed, but only abnormal results are displayed) Labs Reviewed - No data to display  EKG None  Radiology No results found.  Procedures .Marland KitchenIncision and Drainage  Date/Time: 01/31/2022 5:14 AM  Performed by: Merryl Hacker, MD Authorized by: Merryl Hacker, MD   Consent:    Consent obtained:  Verbal   Consent given by:  Patient   Risks, benefits, and alternatives were discussed: yes     Risks discussed:  Bleeding, incomplete drainage and pain   Alternatives discussed:  No treatment Location:    Type:  Abscess   Location:  Upper extremity   Upper extremity location:  Finger   Finger location:  R long finger Pre-procedure details:    Skin preparation:  Povidone-iodine Sedation:  Sedation type:  None Anesthesia:    Anesthesia method:  Nerve block   Block location:  Digital block   Block needle gauge:  25 G   Block anesthetic:  Lidocaine 2% w/o epi   Block injection procedure:  Anatomic landmarks identified, introduced needle, incremental injection and anatomic landmarks palpated   Block outcome:  Incomplete block Procedure type:    Complexity:  Simple Procedure details:    Ultrasound guidance: no     Needle aspiration: no     Incision types:  Stab incision   Incision depth:  Dermal   Drainage:  Purulent   Drainage amount:  Moderate   Packing materials:  None Post-procedure details:    Procedure completion:  Tolerated Comments:     Drainage of paronychia tolerated     Medications Ordered in ED Medications   lidocaine (XYLOCAINE) 2 % (with pres) injection 200 mg (200 mg Infiltration Given by Other 01/31/22 9379)    ED Course/ Medical Decision Making/ A&P                           Medical Decision Making Risk Prescription drug management.   This patient presents to the ED for concern of pain of the right third digit, this involves an extensive number of treatment options, and is a complaint that carries with it a high risk of complications and morbidity.  I considered the following differential and admission for this acute, potentially life threatening condition.  The differential diagnosis includes infection, injury  MDM:    This is a 26 year old female who presents with pain in the right third digit.  Physical exam consistent with paronychia.  There is no significant erythema.  This was drained at the bedside with moderate purulence expressed.  It was soaked in warm soapy water.  We will place him a few days of antibiotics.  Patient was given supportive measures at home.  (Labs, imaging, consults)  Labs: I Ordered, and personally interpreted labs.  The pertinent results include: None  Imaging Studies ordered: I ordered imaging studies including none I independently visualized and interpreted imaging. I agree with the radiologist interpretation  Additional history obtained from significant other at bedside.  External records from outside source obtained and reviewed including prior evaluations  Cardiac Monitoring: The patient was maintained on a cardiac monitor.  I personally viewed and interpreted the cardiac monitored which showed an underlying rhythm of: Normal sinus rhythm  Reevaluation: After the interventions noted above, I reevaluated the patient and found that they have :resolved  Social Determinants of Health: Lives independently  Disposition: Discharge  Co morbidities that complicate the patient evaluation  Past Medical History:  Diagnosis Date   HSV-2 infection  01/07/2020   Medical history non-contributory    Pollen allergies      Medicines Meds ordered this encounter  Medications   lidocaine (XYLOCAINE) 2 % (with pres) injection 200 mg   cephALEXin (KEFLEX) 500 MG capsule    Sig: Take 1 capsule (500 mg total) by mouth 3 (three) times daily.    Dispense:  15 capsule    Refill:  0    I have reviewed the patients home medicines and have made adjustments as needed  Problem List / ED Course: Problem List Items Addressed This Visit   None Visit Diagnoses     Paronychia of finger of right hand    -  Primary   Relevant Medications   cephALEXin (KEFLEX) 500 MG  capsule                   Final Clinical Impression(s) / ED Diagnoses Final diagnoses:  Paronychia of finger of right hand    Rx / DC Orders ED Discharge Orders          Ordered    cephALEXin (KEFLEX) 500 MG capsule  3 times daily        01/31/22 0510              Marqus Macphee, Barbette Hair, MD 01/31/22 [phone]

## 2022-02-13 ENCOUNTER — Ambulatory Visit: Payer: Self-pay

## 2022-02-13 ENCOUNTER — Other Ambulatory Visit (HOSPITAL_COMMUNITY)
Admission: RE | Admit: 2022-02-13 | Discharge: 2022-02-13 | Disposition: A | Discharge status: Home / Self Care | Payer: Medicaid Other | Source: Ambulatory Visit | Attending: Family Medicine | Admitting: Family Medicine

## 2022-02-13 ENCOUNTER — Ambulatory Visit (INDEPENDENT_AMBULATORY_CARE_PROVIDER_SITE_OTHER): Payer: Medicaid Other

## 2022-02-13 DIAGNOSIS — N9089 Other specified noninflammatory disorders of vulva and perineum: Secondary | ICD-10-CM | POA: Insufficient documentation

## 2022-02-13 DIAGNOSIS — N898 Other specified noninflammatory disorders of vagina: Secondary | ICD-10-CM

## 2022-02-13 DIAGNOSIS — R350 Frequency of micturition: Secondary | ICD-10-CM | POA: Diagnosis present

## 2022-02-13 NOTE — Progress Notes (Signed)
Pt here today for self swab. Pt states having urinary frequency and burning sensation when peeing and around clitoris. Pt denies any vaginal discharge, odor or itching. Pt states just finished taking Rx Keflex from 8/16. Pt advised for UC and self swab today. Self swab collected today. Pt advised results will take 24-48 hours and will see results in mychart and will be notified if needs further treatment. Pt verbalized understanding. Pt denies any exposure to STDs. Advised UC will take at least 3 days to result.  [place]

## 2022-02-14 ENCOUNTER — Telehealth: Payer: Self-pay

## 2022-02-14 LAB — CERVICOVAGINAL ANCILLARY ONLY
Bacterial Vaginitis (gardnerella): POSITIVE — AB
Candida Glabrata: NEGATIVE
Candida Vaginitis: POSITIVE — AB
Chlamydia: NEGATIVE
Comment: NEGATIVE
Comment: NEGATIVE
Comment: NEGATIVE
Comment: NEGATIVE
Comment: NEGATIVE
Comment: NORMAL
Neisseria Gonorrhea: NEGATIVE
Trichomonas: NEGATIVE

## 2022-02-14 MED ORDER — METRONIDAZOLE 500 MG PO TABS: 500.0000 mg | ORAL_TABLET | Freq: Two times a day (BID) | ORAL | 0 refills | Status: DC

## 2022-02-14 MED ORDER — FLUCONAZOLE 150 MG PO TABS: 150.0000 mg | ORAL_TABLET | Freq: Once | ORAL | 0 refills | Status: AC

## 2022-02-14 NOTE — Telephone Encounter (Signed)
-----   Message from Renee Harder, CNM sent at 02/14/2022  2:24 PM EDT ----- Patient has yeast and BV. Can you please send rx per protocol?  Thanks! Andee Poles

## 2022-02-14 NOTE — Telephone Encounter (Signed)
Call placed to pt. Spoke with pt. Pt given results and recommendations per Andee Poles, CNM. Pt verbalized understanding and agreeable to plan of care. Rx sent to pharmacy on file.  [place]

## 2022-02-15 LAB — URINE CULTURE

## 2022-03-14 ENCOUNTER — Ambulatory Visit: Payer: Medicaid Other

## 2022-04-05 ENCOUNTER — Ambulatory Visit: Payer: Medicaid Other

## 2022-04-05 NOTE — Progress Notes (Deleted)
Sheri Parker here for Depo-Provera Injection. Injection administered without complication. Patient will return in 3 months for next injection between 06/21/22 and 07/05/22. Next annual visit due August 2024.   Annabell Howells, RN 04/05/2022  8:27 AM

## 2022-04-12 ENCOUNTER — Ambulatory Visit (INDEPENDENT_AMBULATORY_CARE_PROVIDER_SITE_OTHER): Payer: Medicaid Other

## 2022-04-12 ENCOUNTER — Other Ambulatory Visit (HOSPITAL_COMMUNITY)
Admission: RE | Admit: 2022-04-12 | Discharge: 2022-04-12 | Disposition: A | Discharge status: Home / Self Care | Payer: Medicaid Other | Source: Ambulatory Visit | Attending: Family Medicine | Admitting: Family Medicine

## 2022-04-12 VITALS — BP 100/79 | HR 87

## 2022-04-12 DIAGNOSIS — N898 Other specified noninflammatory disorders of vagina: Secondary | ICD-10-CM

## 2022-04-12 DIAGNOSIS — Z3042 Encounter for surveillance of injectable contraceptive: Secondary | ICD-10-CM | POA: Diagnosis not present

## 2022-04-12 MED ORDER — MEDROXYPROGESTERONE ACETATE 150 MG/ML IM SUSP
150.0000 mg | Freq: Once | INTRAMUSCULAR | Status: AC
  Administered 2022-04-12: 150 mg via INTRAMUSCULAR

## 2022-04-12 NOTE — Progress Notes (Signed)
Sheri Parker here for Depo-Provera Injection. Injection administered without complication. Patient will return in 3 months for next injection between 01/11 and 01/25. Next annual visit due by 06/2022.   Pt also requested a self swab at this time. Pt education on how to self swab. Swab obtained. Pt informed she would be notified of results with 24-48 hours via MyChart.   Pt verbalized understanding and denied further questions today.   Darlyne Russian, RN  04/12/2022  3:10 PM

## 2022-04-13 LAB — CERVICOVAGINAL ANCILLARY ONLY
Bacterial Vaginitis (gardnerella): NEGATIVE
Candida Glabrata: NEGATIVE
Candida Vaginitis: NEGATIVE
Chlamydia: NEGATIVE
Comment: NEGATIVE
Comment: NEGATIVE
Comment: NEGATIVE
Comment: NEGATIVE
Comment: NEGATIVE
Comment: NORMAL
Neisseria Gonorrhea: NEGATIVE
Trichomonas: NEGATIVE

## 2022-06-29 ENCOUNTER — Ambulatory Visit (INDEPENDENT_AMBULATORY_CARE_PROVIDER_SITE_OTHER): Payer: Medicaid Other | Admitting: Medical

## 2022-06-29 ENCOUNTER — Encounter: Payer: Self-pay | Admitting: Medical

## 2022-06-29 ENCOUNTER — Other Ambulatory Visit (HOSPITAL_COMMUNITY)
Admission: RE | Admit: 2022-06-29 | Discharge: 2022-06-29 | Disposition: A | Discharge status: Home / Self Care | Payer: Medicaid Other | Source: Ambulatory Visit | Attending: Medical | Admitting: Medical

## 2022-06-29 VITALS — BP 113/86 | HR 91 | Ht 67.0 in | Wt 138.1 lb

## 2022-06-29 DIAGNOSIS — Z01419 Encounter for gynecological examination (general) (routine) without abnormal findings: Secondary | ICD-10-CM | POA: Diagnosis not present

## 2022-06-29 DIAGNOSIS — Z3042 Encounter for surveillance of injectable contraceptive: Secondary | ICD-10-CM | POA: Diagnosis not present

## 2022-06-29 DIAGNOSIS — Z202 Contact with and (suspected) exposure to infections with a predominantly sexual mode of transmission: Secondary | ICD-10-CM | POA: Insufficient documentation

## 2022-06-29 MED ORDER — MEDROXYPROGESTERONE ACETATE 150 MG/ML IM SUSP
150.0000 mg | Freq: Once | INTRAMUSCULAR | Status: AC
  Administered 2022-06-29: 150 mg via INTRAMUSCULAR

## 2022-06-29 NOTE — Addendum Note (Signed)
Addended by: Madalyn Rob D on: 06/29/2022 10:46 AM   Modules accepted: Orders

## 2022-06-29 NOTE — Progress Notes (Signed)
  History:  Ms. Danyell Shader is a 27 y.o. [id number] who presents to clinic today for annual exam and depo injection. Patient is happy with Depo. She has spotting just prior to next injection, otherwise no bleeding. She denies abnormal discharge, pain, N/V/D or constipation, UTI symptoms or breast concerns.  She is currently sexually active with one female partner and does not use condoms. She would like STD testing today, but declines blood work.   The following portions of the patient's history were reviewed and updated as appropriate: allergies, current medications, family history, past medical history, social history, past surgical history and problem list.  Review of Systems:  Review of Systems  Constitutional:  Negative for fever and malaise/fatigue.  Gastrointestinal:  Negative for abdominal pain, constipation, diarrhea, nausea and vomiting.  Genitourinary:  Negative for dysuria, frequency and urgency.       Neg - vaginal bleeding, discharge, pelvic pain      Objective:  Physical Exam BP 113/86   Pulse 91   Ht '5\' 7"'$  (1.702 m)   Wt 138 lb 1.6 oz (62.6 kg)   BMI 21.63 kg/m  Physical Exam Vitals and nursing note reviewed.  Constitutional:      General: She is not in acute distress.    Appearance: Normal appearance. She is well-developed and normal weight.  HENT:     Head: Normocephalic and atraumatic.  Neck:     Thyroid: No thyromegaly.  Cardiovascular:     Rate and Rhythm: Normal rate and regular rhythm.     Heart sounds: No murmur heard. Pulmonary:     Effort: Pulmonary effort is normal. No respiratory distress.     Breath sounds: Normal breath sounds. No wheezing.  Abdominal:     General: Abdomen is flat. Bowel sounds are normal. There is no distension.     Palpations: Abdomen is soft. There is no mass.     Tenderness: There is no abdominal tenderness. There is no guarding or rebound.  Musculoskeletal:     Cervical back: Neck supple.  Skin:    General: Skin is warm and  dry.     Findings: No erythema.  Neurological:     Mental Status: She is alert and oriented to person, place, and time.  Psychiatric:        Mood and Affect: Mood normal.     Health Maintenance Due  Topic Date Due   HPV VACCINES (1 - 2-dose series) Never done    Assessment & Plan:  1. Encounter for annual routine gynecological examination - Next pap smear due 11/2022  2. Encounter for surveillance of injectable contraceptive - medroxyPROGESTERone (DEPO-PROVERA) injection 150 mg  Return to [facility] in 3 months for Depo Provera, 6 months for pap smear and PRN otherwise  Luvenia Redden, PA-C 06/29/2022 10:40 AM

## 2022-07-01 LAB — CERVICOVAGINAL ANCILLARY ONLY
Chlamydia: NEGATIVE
Comment: NEGATIVE
Comment: NEGATIVE
Comment: NORMAL
Neisseria Gonorrhea: NEGATIVE
Trichomonas: NEGATIVE

## 2022-08-31 ENCOUNTER — Encounter (HOSPITAL_COMMUNITY): Payer: Self-pay | Admitting: Emergency Medicine

## 2022-08-31 ENCOUNTER — Emergency Department (HOSPITAL_COMMUNITY): Payer: Medicaid Other

## 2022-08-31 ENCOUNTER — Other Ambulatory Visit: Payer: Self-pay

## 2022-08-31 ENCOUNTER — Emergency Department (HOSPITAL_COMMUNITY)
Admission: EM | Admit: 2022-08-31 | Discharge: 2022-08-31 | Disposition: A | Discharge status: Home / Self Care | Payer: Self-pay | Attending: Emergency Medicine | Admitting: Emergency Medicine

## 2022-08-31 DIAGNOSIS — K1120 Sialoadenitis, unspecified: Secondary | ICD-10-CM | POA: Insufficient documentation

## 2022-08-31 DIAGNOSIS — Z1152 Encounter for screening for COVID-19: Secondary | ICD-10-CM | POA: Insufficient documentation

## 2022-08-31 DIAGNOSIS — Z91040 Latex allergy status: Secondary | ICD-10-CM | POA: Insufficient documentation

## 2022-08-31 LAB — GROUP A STREP BY PCR: Group A Strep by PCR: NOT DETECTED

## 2022-08-31 LAB — CBC WITH DIFFERENTIAL/PLATELET
Abs Immature Granulocytes: 0.02 10*3/uL (ref 0.00–0.07)
Basophils Absolute: 0.1 10*3/uL (ref 0.0–0.1)
Basophils Relative: 1 %
Eosinophils Absolute: 0.1 10*3/uL (ref 0.0–0.5)
Eosinophils Relative: 2 %
HCT: 39.2 % (ref 36.0–46.0)
Hemoglobin: 12.1 g/dL (ref 12.0–15.0)
Immature Granulocytes: 0 %
Lymphocytes Relative: 30 %
Lymphs Abs: 2.7 10*3/uL (ref 0.7–4.0)
MCH: 23.2 pg — ABNORMAL LOW (ref 26.0–34.0)
MCHC: 30.9 g/dL (ref 30.0–36.0)
MCV: 75.1 fL — ABNORMAL LOW (ref 80.0–100.0)
Monocytes Absolute: 0.6 10*3/uL (ref 0.1–1.0)
Monocytes Relative: 7 %
Neutro Abs: 5.5 10*3/uL (ref 1.7–7.7)
Neutrophils Relative %: 60 %
Platelets: 196 10*3/uL (ref 150–400)
RBC: 5.22 MIL/uL — ABNORMAL HIGH (ref 3.87–5.11)
RDW: 13.7 % (ref 11.5–15.5)
WBC: 9 10*3/uL (ref 4.0–10.5)
nRBC: 0 % (ref 0.0–0.2)

## 2022-08-31 LAB — I-STAT BETA HCG BLOOD, ED (MC, WL, AP ONLY): I-stat hCG, quantitative: <5 m[IU]/mL (ref <5)

## 2022-08-31 LAB — RESP PANEL BY RT-PCR (RSV, FLU A&B, COVID)  RVPGX2
Influenza A by PCR: NEGATIVE
Influenza B by PCR: NEGATIVE
Resp Syncytial Virus by PCR: NEGATIVE
SARS Coronavirus 2 by RT PCR: NEGATIVE

## 2022-08-31 LAB — BASIC METABOLIC PANEL
Anion gap: 4 — ABNORMAL LOW (ref 5–15)
BUN: 13 mg/dL (ref 6–20)
CO2: 24 mmol/L (ref 22–32)
Calcium: 8.8 mg/dL — ABNORMAL LOW (ref 8.9–10.3)
Chloride: 108 mmol/L (ref 98–111)
Creatinine, Ser: 0.64 mg/dL (ref 0.44–1.00)
GFR, Estimated: >60 mL/min (ref >60)
Glucose, Bld: 114 mg/dL — ABNORMAL HIGH (ref 70–99)
Potassium: 4 mmol/L (ref 3.5–5.1)
Sodium: 136 mmol/L (ref 135–145)

## 2022-08-31 MED ORDER — DEXAMETHASONE SODIUM PHOSPHATE 10 MG/ML IJ SOLN
10.0000 mg | Freq: Once | INTRAMUSCULAR | Status: AC
  Administered 2022-08-31: 10 mg via INTRAVENOUS
  Filled 2022-08-31: qty 1

## 2022-08-31 MED ORDER — SODIUM CHLORIDE (PF) 0.9 % IJ SOLN
INTRAMUSCULAR | Status: AC
  Filled 2022-08-31: qty 50

## 2022-08-31 MED ORDER — IOHEXOL 300 MG/ML  SOLN
80.0000 mL | Freq: Once | INTRAMUSCULAR | Status: AC | PRN
  Administered 2022-08-31: 80 mL via INTRAVENOUS

## 2022-08-31 NOTE — ED Provider Triage Note (Signed)
Emergency Medicine Provider Triage Evaluation Note  Sheri Parker , a 27 y.o. female  was evaluated in triage.  Pt complains of throat swelling/sore throat.  3 days ago started having pain, worse with chewing and swallowing.  Started having swelling to the right side of her throat which is progressively increasing.  Able to swallow, not choking on solids or liquids.  Review of Systems  Per HPI  Physical Exam  BP 124/77 (BP Location: Right Arm)   Pulse 78   Temp 99.1 F (37.3 C) (Oral)   Resp 17   Ht 5\' 7"  (1.702 m)   Wt 62.6 kg   SpO2 99%   BMI 21.61 kg/m  Gen:   Awake, no distress   Resp:  Normal effort  MSK:   Moves extremities without difficulty  Other:  Uvula is midline, normal phonation.  There is right submandibular swelling does not cross midlline   Medical Decision Making  Medically screening exam initiated at 6:39 PM.  Appropriate orders placed.  Sheri Parker was informed that the remainder of the evaluation will be completed by another provider, this initial triage assessment does not replace that evaluation, and the importance of remaining in the ED until their evaluation is complete.     Sherrill Raring, PA-C 08/31/22 1840

## 2022-08-31 NOTE — ED Provider Notes (Addendum)
[place] EMERGENCY DEPARTMENT AT [facility] Provider Note   CSN: [id number] Arrival date & time: 08/31/22  1801     History  Chief Complaint  Patient presents with   Oral Swelling    Sheri Parker is a 27 y.o. female.  HPI   Patient presents due to sore throat x 3 days.  She has not noticed any fevers at home, she has been having swelling to the right side of her throat which has been worse over the last few days.  Is actually improved by eating food.   Home Medications Prior to Admission medications   Medication Sig Start Date End Date Taking? Authorizing Provider  cephALEXin (KEFLEX) 500 MG capsule Take 1 capsule (500 mg total) by mouth 3 (three) times daily. Patient not taking: Reported on 04/12/2022 01/31/22   Horton, Barbette Hair, MD  medroxyPROGESTERone (DEPO-PROVERA) 150 MG/ML injection Inject 150 mg into the muscle every 3 (three) months.    [provider]  metroNIDAZOLE (FLAGYL) 500 MG tablet Take 1 tablet (500 mg total) by mouth 2 (two) times daily. Patient not taking: Reported on 04/12/2022 02/14/22   Renee Harder, CNM  valACYclovir (VALTREX) 1000 MG tablet Take 0.5 tablets (500 mg total) by mouth daily. 01/18/22   [name], MD      Allergies    Latex and Other    Review of Systems   Review of Systems  Physical Exam Updated Vital Signs BP 124/77 (BP Location: Right Arm)   Pulse 78   Temp 99.1 F (37.3 C) (Oral)   Resp 17   Ht 5\' 7"  (1.702 m)   Wt 62.6 kg   SpO2 99%   BMI 21.61 kg/m  Physical Exam Vitals and nursing note reviewed. Exam conducted with a chaperone present.  Constitutional:      General: She is not in acute distress.    Appearance: Normal appearance.  HENT:     Head: Normocephalic and atraumatic.     Mouth/Throat:     Mouth: Mucous membranes are moist.     Pharynx: No posterior oropharyngeal erythema.     Comments: Uvula is midline, small cyst sublingually.  There is submandibular swelling to the right but  does not cross the midline.   Eyes:     General: No scleral icterus.    Extraocular Movements: Extraocular movements intact.     Pupils: Pupils are equal, round, and reactive to light.  Skin:    Coloration: Skin is not jaundiced.  Neurological:     Mental Status: She is alert. Mental status is at baseline.     Coordination: Coordination normal.     ED Results / Procedures / Treatments   Labs (all labs ordered are listed, but only abnormal results are displayed) Labs Reviewed  CBC WITH DIFFERENTIAL/PLATELET - Abnormal; Notable for the following components:      Result Value   RBC 5.22 (*)    MCV 75.1 (*)    MCH 23.2 (*)    All other components within normal limits  BASIC METABOLIC PANEL - Abnormal; Notable for the following components:   Glucose, Bld 114 (*)    Calcium 8.8 (*)    Anion gap 4 (*)    All other components within normal limits  GROUP A STREP BY PCR  RESP PANEL BY RT-PCR (RSV, FLU A&B, COVID)  [id number]  I-STAT BETA HCG BLOOD, ED ([facility], [facility], [facility] ONLY)    EKG None  Radiology CT Soft Tissue Neck W  Contrast  Result Date: 08/31/2022 CLINICAL DATA:  Soft tissue swelling EXAM: CT NECK WITH CONTRAST TECHNIQUE: Multidetector CT imaging of the neck was performed using the standard protocol following the bolus administration of intravenous contrast. RADIATION DOSE REDUCTION: This exam was performed according to the departmental dose-optimization program which includes automated exposure control, adjustment of the mA and/or kV according to patient size and/or use of iterative reconstruction technique. CONTRAST:  72mL OMNIPAQUE IOHEXOL 300 MG/ML  SOLN COMPARISON:  10/12/2017 FINDINGS: PHARYNX AND LARYNX: The nasopharynx, oropharynx and larynx are normal. Visible portions of the oral cavity, tongue base and floor of mouth are normal. Normal epiglottis, vallecula and pyriform sinuses. The larynx is normal. No retropharyngeal abscess, effusion or lymphadenopathy. SALIVARY GLANDS: Enlarged  right submandibular gland with mild adjacent inflammatory stranding. No sialolithiasis. THYROID: Normal. LYMPH NODES: No enlarged or abnormal density lymph nodes. VASCULAR: Major cervical vessels are patent. LIMITED INTRACRANIAL: Normal. VISUALIZED ORBITS: Normal. MASTOIDS AND VISUALIZED PARANASAL SINUSES: No fluid levels or advanced mucosal thickening. No mastoid effusion. SKELETON: No bony spinal canal stenosis. No lytic or blastic lesions. UPPER CHEST: Clear. OTHER: None. IMPRESSION: Enlarged right submandibular gland mild adjacent inflammatory stranding, acute sialoadenitis. No sialolithiasis. Electronically Signed   By: Ulyses Jarred M.D.   On: 08/31/2022 22:51    Procedures Procedures    Medications Ordered in ED Medications  dexamethasone (DECADRON) injection 10 mg (10 mg Intravenous Given 08/31/22 2146)  iohexol (OMNIPAQUE) 300 MG/ML solution 80 mL (80 mLs Intravenous Contrast Given 08/31/22 2239)  sodium chloride (PF) 0.9 % injection (  Given by Other 08/31/22 2226)    ED Course/ Medical Decision Making/ A&P                             Medical Decision Making Amount and/or Complexity of Data Reviewed Labs: ordered. Radiology: ordered.  Risk Prescription drug management.   Patient presents due to facial swelling/sore throat.  Differential includes peritonsillar abscess, retropharyngeal abscess, Ludwig angina, cellulitis, dental infection.  Will check labs, strep and viral panel and CT soft tissue due to concern for ludwig versus history of pharyngeal abscess versus deeper space soft tissue infection  No SIRS criteria, clinically patient is not septic.  Strep and viral panel are negative.  Laboratory workup is unremarkable.  CT soft tissue notable for sialadenitis but no deeper space infection.  Workup discussed with the patient, lozenges prescribed.  Stable for outpatient follow-up.        Final Clinical Impression(s) / ED Diagnoses Final diagnoses:  Sialadenitis     Rx / DC Orders ED Discharge Orders     None         Sherrill Raring, Hershal Coria 08/31/22 2307    Sherrill Raring, PA-C 08/31/22 2308    Drenda Freeze, MD 08/31/22 [phone]

## 2022-08-31 NOTE — ED Triage Notes (Signed)
Patient arrives ambulatory by POV c/o right sided throat swelling and a lump under tongue x 3 days. Sometimes painful to swallow.

## 2022-09-28 ENCOUNTER — Ambulatory Visit: Payer: Medicaid Other

## 2022-11-07 ENCOUNTER — Ambulatory Visit: Payer: Medicaid Other

## 2022-11-20 ENCOUNTER — Other Ambulatory Visit (HOSPITAL_COMMUNITY)
Admission: RE | Admit: 2022-11-20 | Discharge: 2022-11-20 | Disposition: A | Discharge status: Home / Self Care | Payer: Medicaid Other | Source: Ambulatory Visit | Attending: Family Medicine | Admitting: Family Medicine

## 2022-11-20 ENCOUNTER — Other Ambulatory Visit: Payer: Self-pay

## 2022-11-20 ENCOUNTER — Ambulatory Visit (INDEPENDENT_AMBULATORY_CARE_PROVIDER_SITE_OTHER): Payer: Medicaid Other

## 2022-11-20 DIAGNOSIS — N898 Other specified noninflammatory disorders of vagina: Secondary | ICD-10-CM

## 2022-11-20 DIAGNOSIS — B009 Herpesviral infection, unspecified: Secondary | ICD-10-CM

## 2022-11-20 DIAGNOSIS — Z3202 Encounter for pregnancy test, result negative: Secondary | ICD-10-CM | POA: Diagnosis not present

## 2022-11-20 DIAGNOSIS — Z3042 Encounter for surveillance of injectable contraceptive: Secondary | ICD-10-CM

## 2022-11-20 LAB — POCT PREGNANCY, URINE: Preg Test, Ur: NEGATIVE

## 2022-11-20 MED ORDER — MEDROXYPROGESTERONE ACETATE 150 MG/ML IM SUSY
150.0000 mg | PREFILLED_SYRINGE | Freq: Once | INTRAMUSCULAR | Status: AC
  Administered 2022-11-20: 150 mg via INTRAMUSCULAR

## 2022-11-20 MED ORDER — VALACYCLOVIR HCL 500 MG PO TABS: 500.0000 mg | ORAL_TABLET | Freq: Every day | ORAL | 5 refills | Status: AC

## 2022-11-20 NOTE — Progress Notes (Signed)
Sheri Parker here to restart Depo-Provera Injection. UPT today is negative. Pt denies any unprotected intercourse within past 2 weeks. Injection administered without complication. Patient will return in 3 months for next injection between 02/05/23 and 9/3.   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 Also reports vaginal irritation and desires self swab. This was collected by patient. Reviewed we will contact her with any abnormal results.  Pap due now; offered visit with provider, patient may schedule during check out. Next annual visit due January 2025.   Marjo Bicker, RN 11/20/2022  9:56 AM

## 2022-11-21 LAB — CERVICOVAGINAL ANCILLARY ONLY
Bacterial Vaginitis (gardnerella): NEGATIVE
Candida Glabrata: NEGATIVE
Candida Vaginitis: POSITIVE — AB
Chlamydia: NEGATIVE
Comment: NEGATIVE
Comment: NEGATIVE
Comment: NEGATIVE
Comment: NEGATIVE
Comment: NEGATIVE
Comment: NORMAL
Neisseria Gonorrhea: NEGATIVE
Trichomonas: NEGATIVE

## 2022-11-22 ENCOUNTER — Other Ambulatory Visit: Payer: Self-pay | Admitting: Advanced Practice Midwife

## 2022-11-22 DIAGNOSIS — B3731 Acute candidiasis of vulva and vagina: Secondary | ICD-10-CM

## 2022-11-22 MED ORDER — FLUCONAZOLE 150 MG PO TABS: 150.0000 mg | ORAL_TABLET | ORAL | 1 refills | Status: DC | PRN

## 2023-02-01 ENCOUNTER — Ambulatory Visit: Payer: Medicaid Other | Admitting: Obstetrics and Gynecology

## 2023-02-13 ENCOUNTER — Encounter: Payer: Self-pay | Admitting: Advanced Practice Midwife

## 2023-02-14 ENCOUNTER — Ambulatory Visit: Payer: Medicaid Other | Admitting: Advanced Practice Midwife

## 2023-02-20 ENCOUNTER — Ambulatory Visit: Payer: Medicaid Other | Admitting: Advanced Practice Midwife

## 2023-02-22 ENCOUNTER — Ambulatory Visit: Payer: Medicaid Other | Admitting: Family Medicine

## 2023-03-11 ENCOUNTER — Other Ambulatory Visit: Payer: Self-pay

## 2023-03-11 ENCOUNTER — Encounter: Payer: Self-pay | Admitting: Obstetrics and Gynecology

## 2023-03-11 ENCOUNTER — Ambulatory Visit: Payer: Medicaid Other | Admitting: Obstetrics and Gynecology

## 2023-03-11 ENCOUNTER — Other Ambulatory Visit (HOSPITAL_COMMUNITY)
Admission: RE | Admit: 2023-03-11 | Discharge: 2023-03-11 | Disposition: A | Discharge status: Home / Self Care | Payer: Medicaid Other | Source: Ambulatory Visit | Attending: Obstetrics and Gynecology | Admitting: Obstetrics and Gynecology

## 2023-03-11 VITALS — BP 122/72 | HR 62 | Ht 67.0 in | Wt 139.7 lb

## 2023-03-11 DIAGNOSIS — Z01419 Encounter for gynecological examination (general) (routine) without abnormal findings: Secondary | ICD-10-CM | POA: Insufficient documentation

## 2023-03-11 DIAGNOSIS — Z124 Encounter for screening for malignant neoplasm of cervix: Secondary | ICD-10-CM | POA: Diagnosis not present

## 2023-03-11 DIAGNOSIS — Z3042 Encounter for surveillance of injectable contraceptive: Secondary | ICD-10-CM | POA: Diagnosis not present

## 2023-03-11 MED ORDER — CALCIUM ANTACID ULTRA STRENGTH 1000 MG PO CHEW: 800.0000 mg | CHEWABLE_TABLET | Freq: Every day | ORAL | 1 refills | Status: AC

## 2023-03-11 MED ORDER — MEDROXYPROGESTERONE ACETATE 150 MG/ML IM SUSP
150.0000 mg | Freq: Once | INTRAMUSCULAR | Status: AC
  Administered 2023-03-11: 150 mg via INTRAMUSCULAR

## 2023-03-11 MED ORDER — VITAMIN D3 75 MCG (3000 UT) PO TABS: 1.0000 | ORAL_TABLET | Freq: Every day | ORAL | 1 refills | Status: AC

## 2023-03-11 NOTE — Progress Notes (Signed)
Sheri Parker here for Depo-Provera  Injection.  Injection administered without complication. Patient will return in 3 months 12/09-12/23/24 for next injection.  Henrietta Dine, CMA 03/11/2023  10:11 AM

## 2023-03-11 NOTE — Progress Notes (Signed)
  Obstetrics and Gynecology Visit Return Patient Evaluation  Appointment Date: 03/11/2023  Primary Care Provider: Patient, No Pcp Per  OBGYN Clinic: [facility]  Chief Complaint: pap smear, repeat depo provera  History of Present Illness:  Sheri Parker is a 27 y.o. with above CC. June 2021 pap smear wnl. Patient amenorrheic on depo provera which she has been on for several years; patient without questions or concerns. Annual wnl January 2024  Review of Systems: as noted in the History of Present Illness.  Medications:  Current Facility-Administered Medications  Medication Dose Route Frequency Provider Last Rate Last Admin   medroxyPROGESTERone (DEPO-PROVERA) injection 150 mg  150 mg Intramuscular Q90 days Marylene Land, CNM   150 mg at 11/02/21 6578    Allergies: is allergic to latex and other.  Physical Exam:  BP 122/72   Pulse 62   Ht 5\' 7"  (1.702 m)   Wt 139 lb 11.2 oz (63.4 kg)   BMI 21.88 kg/m  Body mass index is 21.88 kg/m. General appearance: Well nourished, well developed female in no acute distress.  Neuro/Psych:  Normal mood and affect.    Pelvic exam:  Cervical exam performed in the presence of a chaperone EGBUS: wnl Vaginal vault: wnl Cervix:  wnl Bimanual: negative   Assessment: patient doing well  Plan:  1. Cervical cancer screening Routine care - Cytology - PAP( [place])  2. Encounter for surveillance of injectable contraceptive D/w her re: likely temporary decrease in bone density if on depo >2 years. Pt amenable to vitamin d and calcium supplementation to try and help ameliorate this - medroxyPROGESTERone (DEPO-PROVERA) injection 150 mg  Return in about 3 months (around 06/10/2023) for depo.  Cornelia Copa MD Attending [facility])

## 2023-03-13 LAB — CYTOLOGY - PAP
Adequacy: ABSENT
Chlamydia: NEGATIVE
Comment: NEGATIVE
Comment: NEGATIVE
Comment: NORMAL
Diagnosis: NEGATIVE
Neisseria Gonorrhea: NEGATIVE
Trichomonas: NEGATIVE

## 2023-04-22 ENCOUNTER — Encounter (HOSPITAL_COMMUNITY): Payer: Self-pay | Admitting: Emergency Medicine

## 2023-04-22 ENCOUNTER — Other Ambulatory Visit: Payer: Self-pay

## 2023-04-22 ENCOUNTER — Emergency Department (HOSPITAL_COMMUNITY)
Admission: EM | Admit: 2023-04-22 | Discharge: 2023-04-22 | Disposition: A | Discharge status: Home / Self Care | Payer: Medicaid Other | Attending: Emergency Medicine | Admitting: Emergency Medicine

## 2023-04-22 DIAGNOSIS — B3731 Acute candidiasis of vulva and vagina: Secondary | ICD-10-CM | POA: Insufficient documentation

## 2023-04-22 DIAGNOSIS — Z23 Encounter for immunization: Secondary | ICD-10-CM | POA: Insufficient documentation

## 2023-04-22 DIAGNOSIS — X19XXXA Contact with other heat and hot substances, initial encounter: Secondary | ICD-10-CM | POA: Insufficient documentation

## 2023-04-22 DIAGNOSIS — T23102A Burn of first degree of left hand, unspecified site, initial encounter: Secondary | ICD-10-CM | POA: Insufficient documentation

## 2023-04-22 DIAGNOSIS — T300 Burn of unspecified body region, unspecified degree: Secondary | ICD-10-CM

## 2023-04-22 DIAGNOSIS — B9689 Other specified bacterial agents as the cause of diseases classified elsewhere: Secondary | ICD-10-CM | POA: Insufficient documentation

## 2023-04-22 DIAGNOSIS — N760 Acute vaginitis: Secondary | ICD-10-CM | POA: Insufficient documentation

## 2023-04-22 DIAGNOSIS — Z91040 Latex allergy status: Secondary | ICD-10-CM | POA: Insufficient documentation

## 2023-04-22 LAB — HIV ANTIBODY (ROUTINE TESTING W REFLEX): HIV Screen 4th Generation wRfx: NONREACTIVE

## 2023-04-22 LAB — URINALYSIS, ROUTINE W REFLEX MICROSCOPIC
Bacteria, UA: NONE SEEN
Bilirubin Urine: NEGATIVE
Glucose, UA: NEGATIVE mg/dL
Hgb urine dipstick: NEGATIVE
Ketones, ur: NEGATIVE mg/dL
Nitrite: NEGATIVE
Protein, ur: NEGATIVE mg/dL
Specific Gravity, Urine: 1.021 (ref 1.005–1.030)
pH: 5 (ref 5.0–8.0)

## 2023-04-22 LAB — WET PREP, GENITAL
Sperm: NONE SEEN
Trich, Wet Prep: NONE SEEN
WBC, Wet Prep HPF POC: >=10 — AB (ref <10)

## 2023-04-22 LAB — RPR: RPR Ser Ql: NONREACTIVE

## 2023-04-22 LAB — PREGNANCY, URINE: Preg Test, Ur: NEGATIVE

## 2023-04-22 MED ORDER — FLUCONAZOLE 150 MG PO TABS
150.0000 mg | ORAL_TABLET | Freq: Once | ORAL | Status: AC
  Administered 2023-04-22: 150 mg via ORAL
  Filled 2023-04-22: qty 1

## 2023-04-22 MED ORDER — TETANUS-DIPHTH-ACELL PERTUSSIS 5-2.5-18.5 LF-MCG/0.5 IM SUSY
0.5000 mL | PREFILLED_SYRINGE | Freq: Once | INTRAMUSCULAR | Status: AC
  Administered 2023-04-22: 0.5 mL via INTRAMUSCULAR
  Filled 2023-04-22: qty 0.5

## 2023-04-22 MED ORDER — METRONIDAZOLE 500 MG PO TABS: 500.0000 mg | ORAL_TABLET | Freq: Two times a day (BID) | ORAL | 0 refills | Status: AC

## 2023-04-22 MED ORDER — CEPHALEXIN 500 MG PO CAPS: 500.0000 mg | ORAL_CAPSULE | Freq: Two times a day (BID) | ORAL | 0 refills | Status: AC

## 2023-04-22 NOTE — ED Triage Notes (Signed)
Patient arrives ambulatory by POV c/o vaginal irritation x 2 days. Denies any odor. Reports mild discharge.

## 2023-04-22 NOTE — Discharge Instructions (Addendum)
Please follow-up with your OB/GYN in regards to recent ER visit.  Today your labs show that you have BV along with yeast infection you are given 1 dose of the Diflucan for the yeast infection which is you need however I have also prescribed Flagyl for the BV.  Please do not drink alcohol with the Flagyl as this will give you an extremely upset stomach.  I request you are being treated for a UTI as well with the Keflex however you will receive a phone call the next few days if the urine culture grows anything and if it does not then you may discontinue antibiotics.  If symptoms change or worsen please return to ER.

## 2023-04-22 NOTE — ED Provider Notes (Signed)
[place] EMERGENCY DEPARTMENT AT [facility] Provider Note   CSN: [id number] Arrival date & time: 04/22/23  4098     History  Chief Complaint  Patient presents with   Vaginal Itching    Sheri Parker is a 27 y.o. female history of current use infections presented for vaginal irritation for the past 2 days.  Patient states feels very similar to past yeast infections and wants to be evaluated for it.  Patient states it burns slightly when she urinates but no hematuria.  Patient denies fevers, nausea/vomiting, pelvic pain, back pain, vaginal discharge.  Patient states that she would also like to be tested for STDs including syphilis and HIV although she does use protection during intercourse.  Patient also notes that she got some grease on her left hand yesterday and had a slight burn and wants to be checked out for that as well but denies any blisters, open wounds, significant pain with says there is mild redness to the area.  Patient denies any new onset weakness, changes sensation/motor skills. Patient states she does not need a pelvic exam.  Home Medications Prior to Admission medications   Medication Sig Start Date End Date Taking? Authorizing Provider  cephALEXin (KEFLEX) 500 MG capsule Take 1 capsule (500 mg total) by mouth 2 (two) times daily for 5 days. 04/22/23 04/27/23 Yes Caliann Leckrone, Beverly Gust, PA-C  metroNIDAZOLE (FLAGYL) 500 MG tablet Take 1 tablet (500 mg total) by mouth 2 (two) times daily for 7 days. 04/22/23 04/29/23 Yes Makyi Ledo, Beverly Gust, PA-C  calcium elemental as carbonate (CALCIUM ANTACID ULTRA STRENGTH) 400 MG chewable tablet Chew 2 tablets (800 mg total) by mouth daily. 03/11/23   Yale Bing, MD  Cholecalciferol (VITAMIN D3) 75 MCG (3000 UT) TABS Take 1 tablet by mouth daily at 6 (six) AM. 03/11/23   Casas Bing, MD  fluconazole (DIFLUCAN) 150 MG tablet Take 1 tablet (150 mg total) by mouth every three (3) days as needed. For three doses 11/22/22   Katrinka Blazing,  [place], CNM  medroxyPROGESTERone (DEPO-PROVERA) 150 MG/ML injection Inject 150 mg into the muscle every 3 (three) months.    [provider]  valACYclovir (VALTREX) 500 MG tablet Take 1 tablet (500 mg total) by mouth daily. 11/20/22   Katrinka Blazing, [place], CNM      Allergies    Latex and Other    Review of Systems   Review of Systems  Physical Exam Updated Vital Signs BP 127/72   Pulse 74   Temp 98.7 F (37.1 C) (Oral)   Resp 16   Ht 5\' 7"  (1.702 m)   Wt 68 kg   SpO2 100%   BMI 23.49 kg/m  Physical Exam Constitutional:      General: She is not in acute distress. Cardiovascular:     Rate and Rhythm: Normal rate and regular rhythm.     Pulses: Normal pulses.  Abdominal:     Palpations: Abdomen is soft.     Tenderness: There is no abdominal tenderness. There is no guarding or rebound.  Skin:    General: Skin is warm and dry.     Comments: Small erythema noted on the left hand by the first and second metacarpal on the dorsal side with no open wounds, blisters, warmth, discharge, tenderness to palpation  Neurological:     Mental Status: She is alert.     ED Results / Procedures / Treatments   Labs (all labs ordered are listed, but only abnormal results are displayed) Labs Reviewed  WET PREP, GENITAL - Abnormal; Notable for the following components:      Result Value   Yeast Wet Prep HPF POC PRESENT (*)    Clue Cells Wet Prep HPF POC PRESENT (*)    WBC, Wet Prep HPF POC >=10 (*)    All other components within normal limits  URINALYSIS, ROUTINE W REFLEX MICROSCOPIC - Abnormal; Notable for the following components:   Leukocytes,Ua LARGE (*)    All other components within normal limits  URINE CULTURE  PREGNANCY, URINE  RPR  HIV ANTIBODY (ROUTINE TESTING W REFLEX)  GC/CHLAMYDIA PROBE AMP ([place]) NOT AT [facility]    EKG None  Radiology No results found.  Procedures Procedures    Medications Ordered in ED Medications  Tdap (BOOSTRIX) injection 0.5 mL  (0.5 mLs Intramuscular Given 04/22/23 0824)  fluconazole (DIFLUCAN) tablet 150 mg (150 mg Oral Given 04/22/23 1610)    ED Course/ Medical Decision Making/ A&P                                 Medical Decision Making Amount and/or Complexity of Data Reviewed Labs: ordered.  Risk Prescription drug management.   Sheri Parker 27 y.o. presented today for STD check, burn to hand. Working DDx that I considered at this time includes, but not limited to, GC/Chlamydia, Trichomonas, PID, BV, Yeast infection, UTI, HIV, Hep C, Syphilis, warts, chancroid, superficial burn.  R/o DDx: pending  Review of prior external notes: 03/11/2023 outpatient visit  Unique Tests and My Interpretation:  UA: Large leukocytes, squamous epithelial cells noted [place]: Yeast and clue cells present GC chlamydia: Pending UPT: Negative HIV: pending RPR: pending  Social Determinants of Health: none  Discussion with Independent Historian: None  Discussion of Management of Tests: None  Risk: Medium: prescription drug management  Risk Stratification Score: None  Plan: On exam patient was in no acute distress with stable vitals.  Patient was endorsing systemic symptoms with her vaginal irritation states this feels very similar to yeast infections and states that she can self swab to further evaluate.  At patient's request we will also test for syphilis gonorrhea chlamydia HIV.  Patient states she does not want to be prophylactically treated as she states she is sensitive to medications and will follow-up on the MyChart app instead.  In terms of patient's burn on her left hand it is extremely superficial with no blisters or open wounds.  A small mild erythema there was nontender to palpation and was not showing any infectious features and so at this time does not require further evaluation.  Patient's tetanus is out of date as it has been greater than 10 years and so will update tetanus.  Labs show that patient has BV  along with a yeast infection and so we will give 1 dose of Diflucan here in Flagyl as well.  Spoke to the patient by not drinking alcohol with the Flagyl.  When I was going through the patient's results patient stated that since her urine did have white cells in it she wants to be treated for a UTI she is having dysuria.  I spoke to the patient by how there is squamous cells present and that this most likely contaminated the sample however patient wants to be ultra sure that she does not have a UTI and so we will add on Keflex as well.  Urine was sent for culture and I spoke to the  patient by how she should receive a phone call the next few days if it is negative and that she can stop taking the Keflex.  Patient was given return precautions. Patient stable for discharge at this time.  Patient verbalized understanding of plan.  This chart was dictated using voice recognition software.  Despite best efforts to proofread,  errors can occur which can change the documentation meaning.         Final Clinical Impression(s) / ED Diagnoses Final diagnoses:  BV (bacterial vaginosis)  Yeast vaginitis  Superficial burn    Rx / DC Orders ED Discharge Orders          Ordered    metroNIDAZOLE (FLAGYL) 500 MG tablet  2 times daily        04/22/23 0856    cephALEXin (KEFLEX) 500 MG capsule  2 times daily        04/22/23 0902              Netta Corrigan, PA-C 04/22/23 1610    Wynetta Fines, MD 04/22/23 1524

## 2023-04-23 ENCOUNTER — Encounter (HOSPITAL_COMMUNITY): Payer: Self-pay

## 2023-04-23 ENCOUNTER — Encounter: Payer: Self-pay | Admitting: Obstetrics and Gynecology

## 2023-04-23 DIAGNOSIS — A749 Chlamydial infection, unspecified: Secondary | ICD-10-CM

## 2023-04-23 LAB — URINE CULTURE: Culture: NO GROWTH

## 2023-04-23 LAB — GC/CHLAMYDIA PROBE AMP (~~LOC~~) NOT AT ARMC
Chlamydia: POSITIVE — AB
Comment: NEGATIVE
Comment: NORMAL
Neisseria Gonorrhea: POSITIVE — AB

## 2023-04-23 MED ORDER — DOXYCYCLINE HYCLATE 100 MG PO TABS: 100.0000 mg | ORAL_TABLET | Freq: Two times a day (BID) | ORAL | 0 refills | Status: AC

## 2023-04-24 ENCOUNTER — Ambulatory Visit (INDEPENDENT_AMBULATORY_CARE_PROVIDER_SITE_OTHER): Payer: Self-pay

## 2023-04-24 VITALS — BP 116/66 | HR 56 | Wt 138.2 lb

## 2023-04-24 DIAGNOSIS — B3731 Acute candidiasis of vulva and vagina: Secondary | ICD-10-CM

## 2023-04-24 DIAGNOSIS — A549 Gonococcal infection, unspecified: Secondary | ICD-10-CM

## 2023-04-24 MED ORDER — CEFTRIAXONE SODIUM 500 MG IJ SOLR
500.0000 mg | Freq: Once | INTRAMUSCULAR | Status: AC
  Administered 2023-04-24: 500 mg via INTRAMUSCULAR

## 2023-04-24 MED ORDER — FLUCONAZOLE 150 MG PO TABS: 150.0000 mg | ORAL_TABLET | Freq: Every day | ORAL | 0 refills | Status: AC

## 2023-04-24 NOTE — Progress Notes (Signed)
Sheri Parker here for treatment of Gonorrhea infection. Rocephin injection administered without complication. Pt also tested positive for Chlamydia; reviewed doxycycline previously sent to pharmacy for pick up. Partner has been notified of need for treatment. Encouraged pt to wait 7 days following last day of antibiotics. Pt reports yeast infections following antibiotic treatment; single Diflucan tablet sent to pharmacy for use after antibiotic course. Pt inquires if she should continue Keflex for possible UTI. Culture showed no growth. Reviewed with Vergie Living, MD who states patient may discontinue Keflex. Will return for Depo injection in 1 month. MyChart message sent with medication instructions for patient to review if needed.  Marjo Bicker, RN 04/24/2023  9:41 AM

## 2023-05-22 ENCOUNTER — Ambulatory Visit: Payer: Medicaid Other

## 2023-09-28 ENCOUNTER — Emergency Department (HOSPITAL_COMMUNITY)
Admission: EM | Admit: 2023-09-28 | Discharge: 2023-09-28 | Disposition: A | Discharge status: Home / Self Care | Payer: Self-pay | Attending: Emergency Medicine | Admitting: Emergency Medicine

## 2023-09-28 ENCOUNTER — Encounter (HOSPITAL_COMMUNITY): Payer: Self-pay | Admitting: Emergency Medicine

## 2023-09-28 ENCOUNTER — Emergency Department (HOSPITAL_COMMUNITY): Payer: Self-pay

## 2023-09-28 DIAGNOSIS — Z91040 Latex allergy status: Secondary | ICD-10-CM | POA: Insufficient documentation

## 2023-09-28 DIAGNOSIS — W232XXA Caught, crushed, jammed or pinched between a moving and stationary object, initial encounter: Secondary | ICD-10-CM | POA: Insufficient documentation

## 2023-09-28 DIAGNOSIS — S93492A Sprain of other ligament of left ankle, initial encounter: Secondary | ICD-10-CM | POA: Insufficient documentation

## 2023-09-28 MED ORDER — IBUPROFEN 800 MG PO TABS
800.0000 mg | ORAL_TABLET | Freq: Once | ORAL | Status: AC
  Administered 2023-09-28: 800 mg via ORAL
  Filled 2023-09-28: qty 1

## 2023-09-28 NOTE — ED Notes (Signed)
 Applied Aced bandage and applied lase up brace and given crutches

## 2023-09-28 NOTE — ED Provider Notes (Signed)
 [place] EMERGENCY DEPARTMENT AT [facility] Provider Note   CSN: [id number] Arrival date & time: 09/28/23  0211     History  Chief Complaint  Patient presents with   Ankle Pain    Sheri Parker is a 28 y.o. female who works in [employer] presents today with concern for left foot and ankle pain after her foot got smashed beneath an Education officer, community jack this evening.  Has been ambulatory on it though with significant pain, bruising over the top of the foot.  No numbness tingling or weakness in the foot, limping secondary to discomfort with ambulation.  No medication prior to arrival.  Significant pain removing the shoe.  No lacerations.  No history of injury to the ankle in the past. HPI     Home Medications Prior to Admission medications   Medication Sig Start Date End Date Taking? Authorizing Provider  calcium elemental as carbonate (CALCIUM ANTACID ULTRA STRENGTH) 400 MG chewable tablet Chew 2 tablets (800 mg total) by mouth daily. 03/11/23   Raynell Caller, MD  Cholecalciferol (VITAMIN D3) 75 MCG (3000 UT) TABS Take 1 tablet by mouth daily at 6 (six) AM. 03/11/23   Raynell Caller, MD  fluconazole (DIFLUCAN) 150 MG tablet Take 1 tablet (150 mg total) by mouth daily. 04/24/23   Raynell Caller, MD  medroxyPROGESTERone (DEPO-PROVERA) 150 MG/ML injection Inject 150 mg into the muscle every 3 (three) months.    [provider]  valACYclovir (VALTREX) 500 MG tablet Take 1 tablet (500 mg total) by mouth daily. 11/20/22   Smith, [place] , CNM      Allergies    Latex and Other    Review of Systems   Review of Systems  Musculoskeletal:        Left ankle pain and swelling.    Physical Exam Updated Vital Signs BP 114/75   Pulse 64   Temp 97.9 F (36.6 C) (Oral)   Resp 16   Ht 5\' 7"  (1.702 m)   Wt 62.7 kg   SpO2 100%   BMI 21.65 kg/m  Physical Exam Vitals and nursing note reviewed.  HENT:     Head: Normocephalic and atraumatic.  Eyes:      General: No scleral icterus.       Right eye: No discharge.        Left eye: No discharge.     Conjunctiva/sclera: Conjunctivae normal.  Pulmonary:     Effort: Pulmonary effort is normal.  Musculoskeletal:     Right knee: Normal.     Left knee: Normal.     Right lower leg: Normal.     Left lower leg: Normal.     Right ankle: Normal.     Right Achilles Tendon: Normal.     Left ankle: Swelling present. No deformity or lacerations. Tenderness present over the medial malleolus, ATF ligament and AITF ligament. Anterior drawer test negative.     Left Achilles Tendon: Normal.     Right foot: Normal.     Left foot: Normal capillary refill. Swelling, tenderness and bony tenderness present. No crepitus. Normal pulse.       Legs:  Skin:    General: Skin is warm and dry.  Neurological:     General: No focal deficit present.     Mental Status: She is alert.  Psychiatric:        Mood and Affect: Mood normal.     ED Results / Procedures / Treatments   Labs (all labs ordered are  listed, but only abnormal results are displayed) Labs Reviewed - No data to display  EKG None  Radiology DG Ankle Complete Left Result Date: 09/28/2023 CLINICAL DATA:  Forklift accident.  Ankle pain EXAM: LEFT ANKLE COMPLETE - 3+ VIEW COMPARISON:  None Available. FINDINGS: There is no evidence of fracture, dislocation, or joint effusion. There is no evidence of arthropathy or other focal bone abnormality. Soft tissues are unremarkable. IMPRESSION: Negative. Electronically Signed   By: Janeece Mechanic M.D.   On: 09/28/2023 02:58    Procedures Procedures    Medications Ordered in ED Medications  ibuprofen (ADVIL) tablet 800 mg (800 mg Oral Given 09/28/23 0351)    ED Course/ Medical Decision Making/ A&P                                 Medical Decision Making 28 year old female with mechanical injury to the ankle.  Normal vital signs, reassuring exam as above.  DDx includes but limited to contusion, sprain,  fracture, dislocation, laceration, compartment syndrome.  Compartments are soft, pulses are intact as is sensation and capillary refill.  Decreased range of motion actively secondary to pain but this provider is able to passively range the joint.  Intact Achilles on exam, no deformity or malalignment of the talus.  Amount and/or Complexity of Data Reviewed Radiology: ordered.    Details: X-ray negative for acute osseous abnormality.  Risk Prescription drug management.   Clinical picture most consistent with acute sprain of the ankle as well as contusion of the ankle and foot.  Supportive sleeve offered to the patient, crutches provided.  Analgesia offered.  Clinical concern for emergent underlying injury that would warrant further ED workup and patient management is exceedingly low.  Return precautions given.  Damary  voiced understanding of her medical evaluation and treatment plan. Each of their questions answered to their expressed satisfaction.  Return precautions were given.  Patient is well-appearing, stable, and was discharged in good condition.  This chart was dictated using voice recognition software, Dragon. Despite the best efforts of this provider to proofread and correct errors, errors may still occur which can change documentation meaning.         Final Clinical Impression(s) / ED Diagnoses Final diagnoses:  Sprain of posterior talofibular ligament of left ankle, initial encounter    Rx / DC Orders ED Discharge Orders     None         Arlyne Lame 09/28/23 0408    Palumbo, April, MD 09/28/23 [phone]

## 2023-09-28 NOTE — Discharge Instructions (Signed)
 You were seen in the ER today for your ankle injury.  You may use the provided crutches as needed for comfort but do continue to bear weight on the leg as tolerated.  You may use Tylenol and ibuprofen as needed for your discomfort, keep the leg elevated when resting, you may ice the leg for 15 to 20 minutes at a time a few times a day in the first 2 days to help with the swelling.  Follow-up in the outpatient setting with your primary care doctor return to the ER with any new severe symptoms.

## 2023-11-05 ENCOUNTER — Encounter (HOSPITAL_COMMUNITY): Payer: Self-pay

## 2023-11-05 ENCOUNTER — Emergency Department (HOSPITAL_COMMUNITY)
Admission: EM | Admit: 2023-11-05 | Discharge: 2023-11-05 | Disposition: A | Discharge status: Home / Self Care | Payer: Self-pay | Attending: Emergency Medicine | Admitting: Emergency Medicine

## 2023-11-05 ENCOUNTER — Other Ambulatory Visit: Payer: Self-pay

## 2023-11-05 ENCOUNTER — Emergency Department (HOSPITAL_COMMUNITY): Payer: Self-pay

## 2023-11-05 DIAGNOSIS — Z91040 Latex allergy status: Secondary | ICD-10-CM | POA: Insufficient documentation

## 2023-11-05 DIAGNOSIS — J069 Acute upper respiratory infection, unspecified: Secondary | ICD-10-CM | POA: Insufficient documentation

## 2023-11-05 LAB — RESP PANEL BY RT-PCR (RSV, FLU A&B, COVID)  RVPGX2
Influenza A by PCR: NEGATIVE
Influenza B by PCR: NEGATIVE
Resp Syncytial Virus by PCR: NEGATIVE
SARS Coronavirus 2 by RT PCR: NEGATIVE

## 2023-11-05 MED ORDER — ALBUTEROL SULFATE HFA 108 (90 BASE) MCG/ACT IN AERS: 1.0000 | INHALATION_SPRAY | Freq: Four times a day (QID) | RESPIRATORY_TRACT | 0 refills | Status: AC | PRN

## 2023-11-05 MED ORDER — IPRATROPIUM-ALBUTEROL 0.5-2.5 (3) MG/3ML IN SOLN
3.0000 mL | Freq: Once | RESPIRATORY_TRACT | Status: AC
  Administered 2023-11-05: 3 mL via RESPIRATORY_TRACT
  Filled 2023-11-05: qty 3

## 2023-11-05 MED ORDER — PREDNISONE 20 MG PO TABS
60.0000 mg | ORAL_TABLET | Freq: Once | ORAL | Status: AC
  Administered 2023-11-05: 60 mg via ORAL
  Filled 2023-11-05: qty 3

## 2023-11-05 MED ORDER — ACETAMINOPHEN 500 MG PO TABS
1000.0000 mg | ORAL_TABLET | Freq: Once | ORAL | Status: AC
  Administered 2023-11-05: 1000 mg via ORAL
  Filled 2023-11-05: qty 2

## 2023-11-05 MED ORDER — PREDNISONE 10 MG PO TABS: 20.0000 mg | ORAL_TABLET | Freq: Every day | ORAL | 0 refills | Status: AC

## 2023-11-05 NOTE — ED Triage Notes (Addendum)
 Pt game in for flu symptoms. Pt has been wheezing, productive cough, chills, headache, and sore throat. Pt has had these symptoms for about four days.

## 2023-11-05 NOTE — Discharge Instructions (Addendum)
 Please follow-up with your primary care provider regards recent ER visit. Today your labs and imaging are reassuring most likely have a viral illness. Please use Tylenol  or ibuprofen  every 6 hours as needed for pain. I have prescribed an albuterol inhaler along with a few days with the steroids to help with your wheezing.  Please remain hydrated and eat food as tolerated. If symptoms change or worsen please return to the ER.

## 2023-11-05 NOTE — ED Provider Notes (Signed)
 [place] [facility] AT [facility] Provider Note   CSN: [id number] Arrival date & time: 11/05/23  1101     History  Chief Complaint  Patient presents with   Chills   Cough   Sore Throat    Sheri Parker is a 28 y.o. female history of asthma presented with 4 days of productive cough with yellow sputum, wheezing, general malaise.  Patient states that she also has a sore throat as well.  Patient states she is able to eat and drink but that hurts to swallow due to the sore throat.  Patient denies any fevers chest pain or shortness of breath but states that she is wheezing.  Patient does note that she has history of asthma but does not have an asthma inhaler.  Patient is unsure of sick contacts.   Home Medications Prior to Admission medications   Medication Sig Start Date End Date Taking? Authorizing Provider  albuterol (VENTOLIN HFA) 108 (90 Base) MCG/ACT inhaler Inhale 1-2 puffs into the lungs every 6 (six) hours as needed for up to 1 day for wheezing or shortness of breath. 11/05/23 11/06/23 Yes Jemma Rasp, Arlin Benes, PA-C  predniSONE (DELTASONE) 10 MG tablet Take 2 tablets (20 mg total) by mouth daily for 4 days. 11/05/23 11/09/23 Yes Cayde Held, Arlin Benes, PA-C  calcium  elemental as carbonate (CALCIUM  ANTACID ULTRA STRENGTH) 400 MG chewable tablet Chew 2 tablets (800 mg total) by mouth daily. 03/11/23   Raynell Caller, MD  Cholecalciferol (VITAMIN D3) 75 MCG (3000 UT) TABS Take 1 tablet by mouth daily at 6 (six) AM. 03/11/23   Raynell Caller, MD  fluconazole  (DIFLUCAN ) 150 MG tablet Take 1 tablet (150 mg total) by mouth daily. 04/24/23   Raynell Caller, MD  medroxyPROGESTERone  (DEPO-PROVERA ) 150 MG/ML injection Inject 150 mg into the muscle every 3 (three) months.    [provider]  valACYclovir  (VALTREX ) 500 MG tablet Take 1 tablet (500 mg total) by mouth daily. 11/20/22   Smith, [place] , CNM      Allergies    Latex and Other    Review of Systems   Review of  Systems  Respiratory:  Positive for cough.     Physical Exam Updated Vital Signs BP 117/83   Pulse 83   Temp 97.6 F (36.4 C) (Oral)   Resp 18   LMP  (LMP Unknown) Comment: Pt sts irregular periods since going off of depo several months ago.  SpO2 98%  Physical Exam Vitals reviewed.  Constitutional:      General: She is not in acute distress. HENT:     Head: Normocephalic and atraumatic.     Right Ear: Tympanic membrane, ear canal and external ear normal.     Left Ear: Tympanic membrane, ear canal and external ear normal.     Nose: Rhinorrhea present.     Mouth/Throat:     Mouth: Mucous membranes are moist.     Pharynx: No oropharyngeal exudate or posterior oropharyngeal erythema.  Eyes:     Extraocular Movements: Extraocular movements intact.     Conjunctiva/sclera: Conjunctivae normal.     Pupils: Pupils are equal, round, and reactive to light.  Cardiovascular:     Rate and Rhythm: Normal rate and regular rhythm.     Pulses: Normal pulses.     Heart sounds: Normal heart sounds.     Comments: 2+ bilateral radial/dorsalis pedis pulses with regular rate Pulmonary:     Effort: Pulmonary effort is normal. No respiratory distress.  Breath sounds: Wheezing (Left upper lobe) present.  Abdominal:     Palpations: Abdomen is soft.     Tenderness: There is no abdominal tenderness. There is no guarding or rebound.  Musculoskeletal:        General: Normal range of motion.     Cervical back: Normal range of motion and neck supple. No rigidity or tenderness.     Comments: 5 out of 5 bilateral grip/leg extension strength  Lymphadenopathy:     Cervical: No cervical adenopathy.  Skin:    General: Skin is warm and dry.     Capillary Refill: Capillary refill takes less than 2 seconds.  Neurological:     General: No focal deficit present.     Mental Status: She is alert and oriented to person, place, and time.     Comments: Sensation intact in all 4 limbs  Psychiatric:         Mood and Affect: Mood normal.     ED Results / Procedures / Treatments   Labs (all labs ordered are listed, but only abnormal results are displayed) Labs Reviewed  RESP PANEL BY RT-PCR (RSV, FLU A&B, COVID)  [id number]    EKG None  Radiology No results found.  Procedures Procedures    Medications Ordered in ED Medications  predniSONE (DELTASONE) tablet 60 mg (60 mg Oral Given 11/05/23 1220)  ipratropium-albuterol (DUONEB) 0.5-2.5 (3) MG/3ML nebulizer solution 3 mL (3 mLs Nebulization Given 11/05/23 1216)  acetaminophen  (TYLENOL ) tablet 1,000 mg (1,000 mg Oral Given 11/05/23 1220)    ED Course/ Medical Decision Making/ A&P                                 Medical Decision Making Amount and/or Complexity of Data Reviewed Radiology: ordered.  Risk OTC drugs. Prescription drug management.   Sheri Parker 28 y.o. presented today for URI like symptoms. Working DDx that I considered at this time includes, but not limited to, viral illness, pharyngitis, mono, sinusitis, electrolyte abnormality, AOM, pneumonia.  R/o DDx: pharyngitis, mono, sinusitis, electrolyte abnormality, AOM, pneumonia: these diagnoses are not consistent with patient's history, presentation, physical exam, labs/imaging findings.  Review of prior external notes: 10/02/2023 office visit  Unique Tests and My Independent Interpretation:  Respiratory Panel: Negative Chest x-ray: No acute pathology  Social Determinants of Health: none  Discussion with Independent Historian: Significant other  Discussion of Management of Tests: None  Risk: Medium: prescription drug management  Risk Stratification Score: None  Plan: On exam patient was no acute distress with stable vitals.  Patient's exam does show left upper lobe wheezing and so will give breathing treatment along with prednisone here anticipate discharge with prednisone taper.  Chest x-ray was ordered as patient states she was coughing up yellow sputum to  rule out pneumonia however do feel patient has viral illness at the bare minimum as patient states she feels that she has the flu.  Patient was p.o. challenged and is safe to be discharged at this time follow-up outpatient.  Patient is not requiring any oxygen and has not required any oxygen at any point during the ED stay.  I recommended patient use Tylenol  every 6 hours needed for pain remain hydrated.  After shared decision making patient states she is comfortable being discharged and if her chest x-ray shows any acute pathology we will notify her however with my independent rotation of the chest x-ray there does not appear to be  any acute pathology.  Recommend patient follows up with primary care provider.  Will also prescribe steroids and an albuterol inhaler as I do believe patient has underlying asthma as being exacerbated with a viral illness.  On recheck after the breathing treatment patient's lungs are clear to auscultation bilaterally and breathing has improved.  At this time patient is safe to be discharged.  Patient was given return precautions. Patient stable for discharge at this time.  Patient verbalized understanding of plan.  This chart was dictated using voice recognition software.  Despite best efforts to proofread,  errors can occur which can change the documentation meaning.        Final Clinical Impression(s) / ED Diagnoses Final diagnoses:  Viral URI with cough    Rx / DC Orders ED Discharge Orders          Ordered    albuterol (VENTOLIN HFA) 108 (90 Base) MCG/ACT inhaler  Every 6 hours PRN        11/05/23 1152    predniSONE (DELTASONE) 10 MG tablet  Daily        11/05/23 1152              Keri Veale T, PA-C 11/05/23 1307    Arvilla Birmingham, MD 11/05/23 [phone]

## 2024-02-11 ENCOUNTER — Emergency Department (HOSPITAL_COMMUNITY)
Admission: EM | Admit: 2024-02-11 | Discharge: 2024-02-11 | Disposition: A | Discharge status: Home / Self Care | Payer: Self-pay | Attending: Emergency Medicine | Admitting: Emergency Medicine

## 2024-02-11 ENCOUNTER — Encounter (HOSPITAL_COMMUNITY): Payer: Self-pay | Admitting: Emergency Medicine

## 2024-02-11 ENCOUNTER — Other Ambulatory Visit: Payer: Self-pay

## 2024-02-11 DIAGNOSIS — Z91040 Latex allergy status: Secondary | ICD-10-CM | POA: Insufficient documentation

## 2024-02-11 DIAGNOSIS — N939 Abnormal uterine and vaginal bleeding, unspecified: Secondary | ICD-10-CM | POA: Insufficient documentation

## 2024-02-11 DIAGNOSIS — L292 Pruritus vulvae: Secondary | ICD-10-CM | POA: Insufficient documentation

## 2024-02-11 DIAGNOSIS — R109 Unspecified abdominal pain: Secondary | ICD-10-CM | POA: Insufficient documentation

## 2024-02-11 LAB — WET PREP, GENITAL
Clue Cells Wet Prep HPF POC: NONE SEEN
Sperm: NONE SEEN
Trich, Wet Prep: NONE SEEN
WBC, Wet Prep HPF POC: <10 (ref <10)
Yeast Wet Prep HPF POC: NONE SEEN

## 2024-02-11 LAB — PREGNANCY, URINE: Preg Test, Ur: NEGATIVE

## 2024-02-11 LAB — GC/CHLAMYDIA PROBE AMP (~~LOC~~) NOT AT ARMC
Chlamydia: NEGATIVE
Comment: NEGATIVE
Comment: NORMAL
Neisseria Gonorrhea: NEGATIVE

## 2024-02-11 NOTE — ED Provider Notes (Signed)
 [place] EMERGENCY DEPARTMENT AT [facility] Provider Note   CSN: [id number] Arrival date & time: 02/11/24  0209     Patient presents with: Vaginal Itching and Vaginal Bleeding   Sheri Parker is a 28 y.o. female.   28 year old female presents with complaint of vaginal bleeding and general discomfort for the past 3 days.  Recently stopped Depo injection.  History of dermoid tumor with removal of ovary at age 4.  States using pads, has used 3 pads since 9 PM (over the past 6 hours).  Patient is sexually active and requesting STI testing.  No other complaints or concerns.       Prior to Admission medications   Medication Sig Start Date End Date Taking? Authorizing Provider  albuterol  (VENTOLIN  HFA) 108 (90 Base) MCG/ACT inhaler Inhale 1-2 puffs into the lungs every 6 (six) hours as needed for up to 1 day for wheezing or shortness of breath. 11/05/23 11/06/23  Victor Lynwood DASEN, PA-C  calcium  elemental as carbonate (CALCIUM  ANTACID ULTRA STRENGTH) 400 MG chewable tablet Chew 2 tablets (800 mg total) by mouth daily. 03/11/23   Izell Harari, MD  Cholecalciferol (VITAMIN D3) 75 MCG (3000 UT) TABS Take 1 tablet by mouth daily at 6 (six) AM. 03/11/23   Izell Harari, MD  fluconazole  (DIFLUCAN ) 150 MG tablet Take 1 tablet (150 mg total) by mouth daily. 04/24/23   Izell Harari, MD  medroxyPROGESTERone  (DEPO-PROVERA ) 150 MG/ML injection Inject 150 mg into the muscle every 3 (three) months.    [provider]  valACYclovir  (VALTREX ) 500 MG tablet Take 1 tablet (500 mg total) by mouth daily. 11/20/22   Smith, [place] , CNM    Allergies: Latex and Other    Review of Systems Negative except as per HPI Updated Vital Signs BP 128/76 (BP Location: Left Arm)   Pulse 75   Temp 98.4 F (36.9 C) (Oral)   Resp 18   LMP 02/03/2024   SpO2 100%   Physical Exam Vitals and nursing note reviewed. Exam conducted with a chaperone present.  Constitutional:      General: She is  not in acute distress.    Appearance: She is well-developed. She is not diaphoretic.  HENT:     Head: Normocephalic and atraumatic.  Pulmonary:     Effort: Pulmonary effort is normal.  Abdominal:     Palpations: Abdomen is soft.     Tenderness: There is abdominal tenderness. There is no right CVA tenderness or left CVA tenderness.  Genitourinary:    Vagina: Bleeding present.     Cervix: No cervical motion tenderness or friability.     Uterus: Normal.      Adnexa: Right adnexa normal and left adnexa normal.     Comments: Mild active bleeding Skin:    General: Skin is warm and dry.  Neurological:     Mental Status: She is alert and oriented to person, place, and time.  Psychiatric:        Behavior: Behavior normal.     (all labs ordered are listed, but only abnormal results are displayed) Labs Reviewed  WET PREP, GENITAL  PREGNANCY, URINE  GC/CHLAMYDIA PROBE AMP ([place]) NOT AT [facility]    EKG: None  Radiology: No results found.   Procedures   Medications Ordered in the ED - No data to display  Medical Decision Making Amount and/or Complexity of Data Reviewed Labs: ordered.   This patient presents to the ED for concern of vaginal bleeding, this involves an extensive number of treatment options, and is a complaint that carries with it a high risk of complications and morbidity.  The differential diagnosis includes but not limited to pregnancy related emergency, STI, PID   Co morbidities / Chronic conditions that complicate the patient evaluation  Sexually active, history of dermoid tumor    Additional history obtained:  Additional history obtained from EMR External records from outside source obtained and reviewed including HIV, RPR negative 04/22/2023, gc/chlamydia + 04/22/23   Lab Tests:  I Ordered, and personally interpreted labs.  The pertinent results include: GC chlamydia sent out.  Wet prep shows normal. UPreg  negative    Problem List / ED Course / Critical interventions / Medication management  28 year old female presents with concern for vaginal bleeding.  Reports recently discontinuing her Depo injection.  hCG is negative.  No significant pelvic pain on exam, no significant bleeding.  Vitals are stable.  Declines CBC to check H&H as she states she would decline a blood transfusion in emergency.  Wet prep is negative.  GC chlamydia sent out, advised to follow-up in MyChart for results and treatment if needed. I have reviewed the patients home medicines and have made adjustments as needed   Social Determinants of Health:  No PCP on file   Test / Admission - Considered:  Stable for discharge  Offered CBC, patient declines, states she would refuse a blood transfusion if needed to save her life      Final diagnoses:  Vaginal bleeding    ED Discharge Orders     None          Beverley Leita LABOR, PA-C 02/11/24 0434    Jerral Meth, MD 02/11/24 [phone]

## 2024-02-11 NOTE — Discharge Instructions (Signed)
You can make an appointment to see a GYN provider:   [facility]  [address]  [phone]   [facility] [place]  [address]  [phone]   [facility] [place]  [address]  [phone]   [facility]  [address]  [phone]   [facility]  [address]  [phone]   If you already have an established OB/GYN provider in the area you can make an appointment with them as well.   

## 2024-02-11 NOTE — ED Triage Notes (Signed)
 Pt reports vaginal discomfort that she describes as an itchy and burning feeling. Also c/o vaginal bleeding x 3 days.
